# Patient Record
Sex: Male | Born: 1950 | Race: White | Hispanic: No | Marital: Single | Smoking: Current every day smoker
Health system: Southern US, Community
[De-identification: ages and names within clinical notes are randomized; demographics above are authoritative.]

## PROBLEM LIST (undated history)

## (undated) DIAGNOSIS — F329 Major depressive disorder, single episode, unspecified: Secondary | ICD-10-CM

## (undated) DIAGNOSIS — J449 Chronic obstructive pulmonary disease, unspecified: Secondary | ICD-10-CM

## (undated) DIAGNOSIS — K7460 Unspecified cirrhosis of liver: Secondary | ICD-10-CM

## (undated) DIAGNOSIS — F32A Depression, unspecified: Secondary | ICD-10-CM

## (undated) DIAGNOSIS — F419 Anxiety disorder, unspecified: Secondary | ICD-10-CM

## (undated) HISTORY — PX: HIP SURGERY: SHX245

---

## 2009-03-23 ENCOUNTER — Emergency Department (HOSPITAL_COMMUNITY): Admission: EM | Admit: 2009-03-23 | Discharge: 2009-03-23 | Payer: Self-pay | Admitting: Emergency Medicine

## 2010-02-19 ENCOUNTER — Emergency Department (HOSPITAL_COMMUNITY)
Admission: EM | Admit: 2010-02-19 | Discharge: 2010-02-19 | Payer: Self-pay | Source: Home / Self Care | Admitting: Emergency Medicine

## 2010-04-17 ENCOUNTER — Emergency Department (HOSPITAL_COMMUNITY)
Admission: EM | Admit: 2010-04-17 | Discharge: 2010-04-18 | Disposition: A | Discharge status: Other Acute Inpatient Hospital | Payer: Self-pay | Source: Home / Self Care | Admitting: Emergency Medicine

## 2010-04-18 ENCOUNTER — Inpatient Hospital Stay (HOSPITAL_COMMUNITY)
Admission: RE | Admit: 2010-04-18 | Discharge: 2010-04-26 | Payer: Self-pay | Source: Home / Self Care | Attending: Psychiatry | Admitting: Psychiatry

## 2010-04-26 LAB — COMPREHENSIVE METABOLIC PANEL
ALT: 12 U/L (ref 0–53)
AST: 22 U/L (ref 0–37)
Albumin: 3.4 g/dL — ABNORMAL LOW (ref 3.5–5.2)
Alkaline Phosphatase: 137 U/L — ABNORMAL HIGH (ref 39–117)
BUN: 9 mg/dL (ref 6–23)
CO2: 21 mEq/L (ref 19–32)
Calcium: 8.9 mg/dL (ref 8.4–10.5)
Chloride: 109 mEq/L (ref 96–112)
Creatinine, Ser: 0.81 mg/dL (ref 0.4–1.5)
GFR calc Af Amer: >60 mL/min (ref >60)
GFR calc non Af Amer: >60 mL/min (ref >60)
Glucose, Bld: 90 mg/dL (ref 70–99)
Potassium: 3.8 mEq/L (ref 3.5–5.1)
Sodium: 138 mEq/L (ref 135–145)
Total Bilirubin: 1 mg/dL (ref 0.3–1.2)
Total Protein: 6.5 g/dL (ref 6.0–8.3)

## 2010-04-26 LAB — CBC
HCT: 43.9 % (ref 39.0–52.0)
Hemoglobin: 15.5 g/dL (ref 13.0–17.0)
MCH: 31.6 pg (ref 26.0–34.0)
MCHC: 35.3 g/dL (ref 30.0–36.0)
MCV: 89.4 fL (ref 78.0–100.0)
Platelets: 210 10*3/uL (ref 150–400)
RBC: 4.91 MIL/uL (ref 4.22–5.81)
RDW: 13.8 % (ref 11.5–15.5)
WBC: 11.3 10*3/uL — ABNORMAL HIGH (ref 4.0–10.5)

## 2010-04-26 LAB — DIFFERENTIAL
Basophils Absolute: 0 10*3/uL (ref 0.0–0.1)
Basophils Relative: 0 % (ref 0–1)
Eosinophils Absolute: 0.1 10*3/uL (ref 0.0–0.7)
Eosinophils Relative: 1 % (ref 0–5)
Lymphocytes Relative: 29 % (ref 12–46)
Lymphs Abs: 3.3 10*3/uL (ref 0.7–4.0)
Monocytes Absolute: 0.7 10*3/uL (ref 0.1–1.0)
Monocytes Relative: 7 % (ref 3–12)
Neutro Abs: 7.1 10*3/uL (ref 1.7–7.7)
Neutrophils Relative %: 63 % (ref 43–77)

## 2010-04-26 LAB — URINE MICROSCOPIC-ADD ON

## 2010-04-26 LAB — SAMPLE TO BLOOD BANK

## 2010-04-26 LAB — URINALYSIS, ROUTINE W REFLEX MICROSCOPIC
Bilirubin Urine: NEGATIVE
Hgb urine dipstick: NEGATIVE
Ketones, ur: NEGATIVE mg/dL
Nitrite: POSITIVE — AB
Protein, ur: NEGATIVE mg/dL
Specific Gravity, Urine: 1.026 (ref 1.005–1.030)
Urine Glucose, Fasting: NEGATIVE mg/dL
Urobilinogen, UA: 1 mg/dL (ref 0.0–1.0)
pH: 6 (ref 5.0–8.0)

## 2010-04-26 LAB — FOLATE: Folate: 8.6 ng/mL

## 2010-04-26 LAB — VITAMIN D 1,25 DIHYDROXY
Vitamin D 1, 25 (OH)2 Total: 28 pg/mL (ref 18–72)
Vitamin D2 1, 25 (OH)2: <8 pg/mL
Vitamin D3 1, 25 (OH)2: 28 pg/mL

## 2010-04-26 LAB — RAPID URINE DRUG SCREEN, HOSP PERFORMED
Amphetamines: NOT DETECTED
Barbiturates: NOT DETECTED
Benzodiazepines: NOT DETECTED
Cocaine: NOT DETECTED
Opiates: NOT DETECTED
Tetrahydrocannabinol: NOT DETECTED

## 2010-04-26 LAB — PROTIME-INR
INR: 1.02 (ref 0.00–1.49)
Prothrombin Time: 13.6 seconds (ref 11.6–15.2)

## 2010-04-26 LAB — OCCULT BLOOD, POC DEVICE: Fecal Occult Bld: NEGATIVE

## 2010-04-26 LAB — TSH: TSH: 4.346 u[IU]/mL (ref 0.350–4.500)

## 2010-04-26 LAB — VITAMIN B12: Vitamin B-12: 328 pg/mL (ref 211–911)

## 2010-04-26 LAB — ETHANOL: Alcohol, Ethyl (B): <5 mg/dL (ref 0–10)

## 2010-04-30 NOTE — Discharge Summary (Signed)
NAME:  [name], [name] NO.:  [id number]  MEDICAL RECORD NO.:  [id number]           PATIENT TYPE:  LOCATION:                                 FACILITY:  PHYSICIAN:  Marlis Edelson, DO        DATE OF BIRTH:  [birth year]  DATE OF ADMISSION:  04/17/2010 DATE OF DISCHARGE:  04/26/2010                              DISCHARGE SUMMARY   AGE:  60.  CHIEF COMPLAINT:  Depression with suicidal thoughts.  HISTORY OF THE CHIEF COMPLAINT:  [name] is a 60 year old Caucasian male admitted to the [facility] on April 17, 2010, following initial presentation to the emergency department with complaint of abdominal pain and rectal bleeding.  He has also history positive for hepatitis C.  He mentioned that he had depressive symptoms with suicidal thoughts for 3 days prior to admission.  He had a plan to jump in front of a moving vehicle.  He was concerned about his parents getting older, his father having Alzheimer disease, and their illnesses and how he, being here in [place] was unable to help them with them living in [place].  He was endorsing voices, which he has had for a number of years, stating "take yourself and kill yourself."  He had not been sleeping well and he was not receiving any psychiatric care.  PAST PSYCHIATRIC HISTORY:  He reports a longstanding history of schizophrenia, chronic paranoid type.  This was his first admission to the [facility].  He has been on Prozac for years and found that to be effective.  He was receiving no current mental health care. He had moved to the [place] area from [place].  He wants to return to [place] following his discharge.  ALCOHOL AND DRUG HISTORY:  Denied.  MEDICAL PROBLEMS:  Current urinary tract infection.  MEDICATIONS ON ADMISSION:  Bactrim per the emergency department.  DRUG ALLERGIES:  None.  HOSPITAL COURSE:  [name] was admitted to the adult unit, where  he integrated well into the adult milieu and attended groups.  He discussed his previous psychiatric history, including the diagnosis of schizophrenia, chronic paranoid type.  He had no history of drug or alcohol problems.  He formerly worked as a Curator.  He has heard voices for a number of years, sometimes a voice telling him to kill himself.  That improved during the course of his hospitalization.  His suicidal ideation equally resolved.  He became pleasant and engaging with staff during the course of his hospitalization, was insightful, and was very engaging with his peers.  LABORATORY TESTING:  Included a TSH that was 4.346 uIU/mL. Serum folate level was 8.6 mg/mL.  Vitamin B12 level was 328 PG/mL.  He did receive a vitamin B12 injection with 1000 mcg IM times 1 due to the vitamin B12 level being below 400 PG/mL.  During the remaining part of his hospitalization, he had no suicidal or homicidal thought, intent, or plan.  His psychotic symptoms improved with the use Seroquel at bedtime.  That dose was eventually titrated to 300 mg at bedtime.  His Prozac was resumed and titrated  to 30 mg daily.  He talked about returning to [place] upon his discharge from the hospital.  That area offered him more support than he found here in [place] and he had no relatives.  He stated that he would go ahead and return to his hotel room which he has paid for through the end of the month, and then work out arrangements to return to [place]. He would also like to work on helping his family and perhaps going to [place], where he can be of further assistance to them.  There was no behavioral discord, hypomania, or mania.  No actions of self-harm or of harming others during the course of his hospitalization. His only concern later in the hospitalization was his inability to smoke at this particular program.  He had no adverse reactions to medications. He tolerated the Seroquel at  300 mg at bedtime, Prozac 30 mg daily.  He also was placed on Bactrim 1 tablet twice per day for 7 days for urinary tract infection as per the above note.  LABORATORY AND IMAGING:  As noted above.  CONSULTATIONS:  None.  COMPLICATIONS:  None.  PROCEDURES:  None.  DISCHARGE IMPRESSIONS:  Axis I:  Schizophrenia, chronic paranoid type, depressive disorder not otherwise specified. Axis II:  Deferred. Axis III:  Urinary tract infection. Axis IV:  Family stressors due to parents' illness. Axis V:  60.  DISCHARGE INSTRUCTIONS: 1. Followup:  He is to follow up with the [facility] on April 27, 2010, at 9 a.m. 2. Medications:     a.     Prozac 30 mg p.o. daily.     b.     Seroquel 300 mg p.o. at bedtime.  SPECIAL INSTRUCTIONS:  He is to return to the hospital for any development of suicidal or homicidal thought, intent, or plan or marked change in mood or affect or increased psychotic symptoms.  He is also to seek emergent care for any adverse reactions to medications.  Keep followup appointments as recommended.  PROGNOSIS:  Fair with appropriate psychiatric followup and medication compliance.      ______________________________ Marlis Edelson, DO     DB/MEDQ  D:  04/26/2010  T:  04/26/2010  Job:  [id number]  Electronically Signed by Marlis Edelson MD on 04/29/2010 07:16:29 PM

## 2010-06-16 ENCOUNTER — Emergency Department (HOSPITAL_COMMUNITY): Payer: Medicare Other

## 2010-06-16 ENCOUNTER — Inpatient Hospital Stay (HOSPITAL_COMMUNITY)
Admission: EM | Admit: 2010-06-16 | Discharge: 2010-06-21 | DRG: 418 | Disposition: A | Discharge status: Other Acute Inpatient Hospital | Payer: Medicare Other | Attending: Internal Medicine | Admitting: Internal Medicine

## 2010-06-16 DIAGNOSIS — F3289 Other specified depressive episodes: Secondary | ICD-10-CM | POA: Diagnosis present

## 2010-06-16 DIAGNOSIS — F200 Paranoid schizophrenia: Secondary | ICD-10-CM | POA: Diagnosis present

## 2010-06-16 DIAGNOSIS — Z5900 Homelessness unspecified: Secondary | ICD-10-CM

## 2010-06-16 DIAGNOSIS — K8000 Calculus of gallbladder with acute cholecystitis without obstruction: Principal | ICD-10-CM | POA: Diagnosis present

## 2010-06-16 DIAGNOSIS — N200 Calculus of kidney: Secondary | ICD-10-CM | POA: Diagnosis present

## 2010-06-16 DIAGNOSIS — R45851 Suicidal ideations: Secondary | ICD-10-CM

## 2010-06-16 DIAGNOSIS — B1920 Unspecified viral hepatitis C without hepatic coma: Secondary | ICD-10-CM | POA: Diagnosis present

## 2010-06-16 DIAGNOSIS — F329 Major depressive disorder, single episode, unspecified: Secondary | ICD-10-CM | POA: Diagnosis present

## 2010-06-16 DIAGNOSIS — F17200 Nicotine dependence, unspecified, uncomplicated: Secondary | ICD-10-CM | POA: Diagnosis present

## 2010-06-16 DIAGNOSIS — N1330 Unspecified hydronephrosis: Secondary | ICD-10-CM | POA: Diagnosis present

## 2010-06-16 LAB — URINE MICROSCOPIC-ADD ON

## 2010-06-16 LAB — CBC
HCT: 40.3 % (ref 39.0–52.0)
Hemoglobin: 13.7 g/dL (ref 13.0–17.0)
MCH: 30.4 pg (ref 26.0–34.0)
MCHC: 34 g/dL (ref 30.0–36.0)
MCV: 89.4 fL (ref 78.0–100.0)
Platelets: 154 10*3/uL (ref 150–400)
RBC: 4.51 MIL/uL (ref 4.22–5.81)
RDW: 15.1 % (ref 11.5–15.5)
WBC: 5.4 10*3/uL (ref 4.0–10.5)

## 2010-06-16 LAB — URINALYSIS, ROUTINE W REFLEX MICROSCOPIC
Bilirubin Urine: NEGATIVE
Glucose, UA: NEGATIVE mg/dL
Hgb urine dipstick: NEGATIVE
Ketones, ur: NEGATIVE mg/dL
Nitrite: NEGATIVE
Protein, ur: NEGATIVE mg/dL
Specific Gravity, Urine: 1.007 (ref 1.005–1.030)
Urobilinogen, UA: 1 mg/dL (ref 0.0–1.0)
pH: 7 (ref 5.0–8.0)

## 2010-06-16 LAB — DIFFERENTIAL
Basophils Absolute: 0 10*3/uL (ref 0.0–0.1)
Basophils Relative: 0 % (ref 0–1)
Eosinophils Absolute: 0.1 10*3/uL (ref 0.0–0.7)
Eosinophils Relative: 1 % (ref 0–5)
Lymphocytes Relative: 29 % (ref 12–46)
Lymphs Abs: 1.6 10*3/uL (ref 0.7–4.0)
Monocytes Absolute: 0.4 10*3/uL (ref 0.1–1.0)
Monocytes Relative: 7 % (ref 3–12)
Neutro Abs: 3.4 10*3/uL (ref 1.7–7.7)
Neutrophils Relative %: 63 % (ref 43–77)

## 2010-06-16 LAB — COMPREHENSIVE METABOLIC PANEL
ALT: 847 U/L — ABNORMAL HIGH (ref 0–53)
AST: 351 U/L — ABNORMAL HIGH (ref 0–37)
Albumin: 3 g/dL — ABNORMAL LOW (ref 3.5–5.2)
Alkaline Phosphatase: 286 U/L — ABNORMAL HIGH (ref 39–117)
BUN: 10 mg/dL (ref 6–23)
CO2: 25 mEq/L (ref 19–32)
Calcium: 8.7 mg/dL (ref 8.4–10.5)
Chloride: 104 mEq/L (ref 96–112)
Creatinine, Ser: 0.75 mg/dL (ref 0.4–1.5)
GFR calc Af Amer: >60 mL/min (ref >60)
GFR calc non Af Amer: >60 mL/min (ref >60)
Glucose, Bld: 100 mg/dL — ABNORMAL HIGH (ref 70–99)
Potassium: 3.7 mEq/L (ref 3.5–5.1)
Sodium: 137 mEq/L (ref 135–145)
Total Bilirubin: 4.1 mg/dL — ABNORMAL HIGH (ref 0.3–1.2)
Total Protein: 6.2 g/dL (ref 6.0–8.3)

## 2010-06-16 LAB — LIPASE, BLOOD: Lipase: 36 U/L (ref 11–59)

## 2010-06-16 LAB — ETHANOL: Alcohol, Ethyl (B): <5 mg/dL (ref 0–10)

## 2010-06-16 LAB — RAPID URINE DRUG SCREEN, HOSP PERFORMED
Amphetamines: NOT DETECTED
Barbiturates: NOT DETECTED
Benzodiazepines: NOT DETECTED
Cocaine: NOT DETECTED
Opiates: NOT DETECTED
Tetrahydrocannabinol: NOT DETECTED

## 2010-06-16 MED ORDER — IOHEXOL 300 MG/ML  SOLN
100.0000 mL | Freq: Once | INTRAMUSCULAR | Status: AC | PRN
  Administered 2010-06-16: 100 mL via INTRAVENOUS

## 2010-06-17 ENCOUNTER — Other Ambulatory Visit (HOSPITAL_COMMUNITY): Payer: Medicare Other

## 2010-06-17 ENCOUNTER — Inpatient Hospital Stay (HOSPITAL_COMMUNITY): Payer: Medicare Other

## 2010-06-17 DIAGNOSIS — F200 Paranoid schizophrenia: Secondary | ICD-10-CM

## 2010-06-17 DIAGNOSIS — R932 Abnormal findings on diagnostic imaging of liver and biliary tract: Secondary | ICD-10-CM

## 2010-06-17 LAB — LIPID PANEL
Triglycerides: 158 mg/dL — ABNORMAL HIGH (ref <150)
VLDL: 32 mg/dL (ref 0–40)

## 2010-06-17 LAB — CBC
HCT: 40.4 % (ref 39.0–52.0)
Hemoglobin: 13.6 g/dL (ref 13.0–17.0)
RBC: 4.52 MIL/uL (ref 4.22–5.81)
WBC: 5.4 10*3/uL (ref 4.0–10.5)

## 2010-06-17 LAB — COMPREHENSIVE METABOLIC PANEL
ALT: 574 U/L — ABNORMAL HIGH (ref 0–53)
AST: 183 U/L — ABNORMAL HIGH (ref 0–37)
Albumin: 2.8 g/dL — ABNORMAL LOW (ref 3.5–5.2)
Alkaline Phosphatase: 261 U/L — ABNORMAL HIGH (ref 39–117)
CO2: 23 mEq/L (ref 19–32)
Chloride: 110 mEq/L (ref 96–112)
Creatinine, Ser: 0.68 mg/dL (ref 0.4–1.5)
GFR calc Af Amer: >60 mL/min (ref >60)
Potassium: 3.5 mEq/L (ref 3.5–5.1)
Sodium: 140 mEq/L (ref 135–145)
Total Bilirubin: 2.5 mg/dL — ABNORMAL HIGH (ref 0.3–1.2)

## 2010-06-17 LAB — HEPATITIS PANEL, ACUTE
HCV Ab: REACTIVE — AB
Hepatitis B Surface Ag: NEGATIVE

## 2010-06-17 LAB — BRAIN NATRIURETIC PEPTIDE: Pro B Natriuretic peptide (BNP): 124 pg/mL — ABNORMAL HIGH (ref 0.0–100.0)

## 2010-06-17 LAB — MAGNESIUM: Magnesium: 1.9 mg/dL (ref 1.5–2.5)

## 2010-06-18 ENCOUNTER — Other Ambulatory Visit: Payer: Self-pay | Admitting: Surgery

## 2010-06-18 ENCOUNTER — Inpatient Hospital Stay (HOSPITAL_COMMUNITY): Payer: Medicare Other

## 2010-06-18 DIAGNOSIS — F329 Major depressive disorder, single episode, unspecified: Secondary | ICD-10-CM

## 2010-06-18 DIAGNOSIS — R932 Abnormal findings on diagnostic imaging of liver and biliary tract: Secondary | ICD-10-CM

## 2010-06-18 DIAGNOSIS — I369 Nonrheumatic tricuspid valve disorder, unspecified: Secondary | ICD-10-CM

## 2010-06-18 DIAGNOSIS — F3289 Other specified depressive episodes: Secondary | ICD-10-CM

## 2010-06-18 LAB — COMPREHENSIVE METABOLIC PANEL
Alkaline Phosphatase: 227 U/L — ABNORMAL HIGH (ref 39–117)
BUN: 5 mg/dL — ABNORMAL LOW (ref 6–23)
Chloride: 109 mEq/L (ref 96–112)
GFR calc non Af Amer: >60 mL/min (ref >60)
Glucose, Bld: 109 mg/dL — ABNORMAL HIGH (ref 70–99)
Potassium: 4 mEq/L (ref 3.5–5.1)
Total Bilirubin: 1.9 mg/dL — ABNORMAL HIGH (ref 0.3–1.2)

## 2010-06-18 LAB — DIFFERENTIAL
Eosinophils Relative: 3 % (ref 0–5)
Lymphocytes Relative: 31 % (ref 12–46)
Lymphs Abs: 1.5 10*3/uL (ref 0.7–4.0)
Monocytes Relative: 9 % (ref 3–12)

## 2010-06-18 LAB — CBC
HCT: 39.6 % (ref 39.0–52.0)
MCV: 91.9 fL (ref 78.0–100.0)
RBC: 4.31 MIL/uL (ref 4.22–5.81)
RDW: 15.2 % (ref 11.5–15.5)
WBC: 4.9 10*3/uL (ref 4.0–10.5)

## 2010-06-18 LAB — PHOSPHORUS: Phosphorus: 4.5 mg/dL (ref 2.3–4.6)

## 2010-06-18 LAB — MRSA PCR SCREENING: MRSA by PCR: NEGATIVE

## 2010-06-18 LAB — MAGNESIUM: Magnesium: 1.9 mg/dL (ref 1.5–2.5)
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
## 2010-06-19 LAB — CBC
HCT: 42.4 % (ref 39.0–52.0)
MCHC: 32.5 g/dL (ref 30.0–36.0)
RDW: 15.2 % (ref 11.5–15.5)

## 2010-06-19 LAB — MAGNESIUM: Magnesium: 1.7 mg/dL (ref 1.5–2.5)

## 2010-06-19 LAB — COMPREHENSIVE METABOLIC PANEL
ALT: 314 U/L — ABNORMAL HIGH (ref 0–53)
Calcium: 8.4 mg/dL (ref 8.4–10.5)
GFR calc Af Amer: >60 mL/min (ref >60)
Glucose, Bld: 147 mg/dL — ABNORMAL HIGH (ref 70–99)
Sodium: 138 mEq/L (ref 135–145)
Total Protein: 5.3 g/dL — ABNORMAL LOW (ref 6.0–8.3)

## 2010-06-19 LAB — DIFFERENTIAL
Basophils Absolute: 0 10*3/uL (ref 0.0–0.1)
Basophils Relative: 0 % (ref 0–1)
Eosinophils Relative: 2 % (ref 0–5)
Monocytes Absolute: 0.4 10*3/uL (ref 0.1–1.0)

## 2010-06-19 NOTE — Consult Note (Signed)
NAME:  [name], [name] NO.:  [id number]  MEDICAL RECORD NO.:  [id number]           PATIENT TYPE:  I  LOCATION:  3005                         FACILITY:  [facility]  PHYSICIAN:  Almond Lint, MD       DATE OF BIRTH:  11/11/1950  DATE OF CONSULTATION:  06/17/2010 DATE OF DISCHARGE:                                CONSULTATION   REQUESTING PROVIDER:  Dr. Hyacinth Meeker in the emergency department.  CHIEF COMPLAINT:  Abdominal pain.  HISTORY OF PRESENT ILLNESS:  [name] is a 60 year old male who actually presented to the emergency department with depression and suicidal ideation x2 weeks.  He was discharged January 17 for similar reasons.  While he was here undergoing psychiatric workup, he complained of abdominal pain for 7 days.  To the emergency department and to me he denied nausea, vomiting, fever, chills, chest pain, shortness of breath, or changes in his bowel habits.  He did state that he had decreased appetite.  He is unable to tell me any factors that relieved the pain or exacerbate the pain.  He denies food exacerbating the pain.  PAST MEDICAL HISTORY:  Significant for schizophrenia and depression with a prior suicide attempt.  He also has hepatitis C and history of kidney stones.  PAST SURGICAL HISTORY:  Negative.  FAMILY HISTORY:  He denies any known knowledge of gallbladder disease in his family.  SOCIAL HISTORY:  He is homeless and smokes a pack a day.  He does not use alcohol or drugs.  REVIEW OF SYSTEMS:  Otherwise negative x11 systems.  MEDICATIONS:  None.  ALLERGIES:  None.  PHYSICAL EXAMINATION:  VITAL SIGNS:  Temperature 97.7, heart rate 69, blood pressure 109/71, respiratory rate 16, 94% on room air. GENERAL:  He is sleepy and withdrawn, crawled up in bed.  He is disheveled and wearing a hospital paper scrub. NECK:  Supple. HEART:  Regular rate and rhythm.  No murmurs. LUNGS:  He has wheezing on inspiration bilaterally with normal  effort. ABDOMEN:  Soft and nondistended.  Tender in the right upper quadrant. EXTREMITIES:  Warm and well perfused with palpable pulses bilaterally and no gross deformities. NEURO:  No gross deficits. PSYCH:  Withdrawn, angry, and flat affect.  LABORATORY DATA:  Sodium 137, potassium 3.7, chloride 104, CO2 25, BUN 10, creatinine 0.75, glucose 100, bilirubin 4.1, AST 351, ALT 847, alk phos 286, white count 5.4, hemoglobin/hematocrit 13.7 and 40.3 and platelet count 154,000.  CT shows a 8 mm gallstone lodged in the gallbladder neck, and the gallbladder wall near the infundibulum is slightly indistinct.  He has nonobstructing renal calculi.  Chest x-ray shows chronic bronchitis.  ASSESSMENT/PLAN:  [name] is 60 year old male with depression and suicidal ideation along with cholecystitis, cholelithiasis and possible choledocholithiasis.  We will recheck his LFTs in the morning and consult GI for possible ERCP as needed.  I think his LFTs are coming from his gallbladder as his LFTs back in January were normal.  IV antibiotics, n.p.o., and lap chole when more medically stable.     Almond Lint, MD     FB/MEDQ  D:  06/17/2010  T:  06/17/2010  Job:  [id number]  Electronically Signed by Almond Lint MD on 06/19/2010 06:33:17 AM

## 2010-06-20 LAB — CBC
Hemoglobin: 13.3 g/dL (ref 13.0–17.0)
MCH: 30.7 pg (ref 26.0–34.0)
RBC: 4.33 MIL/uL (ref 4.22–5.81)
WBC: 6.7 10*3/uL (ref 4.0–10.5)

## 2010-06-20 LAB — COMPREHENSIVE METABOLIC PANEL
ALT: 225 U/L — ABNORMAL HIGH (ref 0–53)
AST: 52 U/L — ABNORMAL HIGH (ref 0–37)
Alkaline Phosphatase: 196 U/L — ABNORMAL HIGH (ref 39–117)
CO2: 27 mEq/L (ref 19–32)
Chloride: 106 mEq/L (ref 96–112)
Creatinine, Ser: 0.91 mg/dL (ref 0.4–1.5)
GFR calc Af Amer: >60 mL/min (ref >60)
GFR calc non Af Amer: >60 mL/min (ref >60)
Potassium: 4 mEq/L (ref 3.5–5.1)
Sodium: 138 mEq/L (ref 135–145)
Total Bilirubin: 1.7 mg/dL — ABNORMAL HIGH (ref 0.3–1.2)

## 2010-06-20 LAB — PHOSPHORUS: Phosphorus: 3.8 mg/dL (ref 2.3–4.6)

## 2010-06-20 LAB — MAGNESIUM: Magnesium: 1.7 mg/dL (ref 1.5–2.5)

## 2010-06-21 ENCOUNTER — Inpatient Hospital Stay (HOSPITAL_COMMUNITY)
Admission: AD | Admit: 2010-06-21 | Discharge: 2010-07-12 | DRG: 885 | Disposition: A | Discharge status: Home / Self Care | Payer: Medicare Other | Source: Ambulatory Visit | Attending: Psychiatry | Admitting: Psychiatry

## 2010-06-21 DIAGNOSIS — F200 Paranoid schizophrenia: Principal | ICD-10-CM

## 2010-06-21 DIAGNOSIS — N390 Urinary tract infection, site not specified: Secondary | ICD-10-CM

## 2010-06-21 DIAGNOSIS — F17200 Nicotine dependence, unspecified, uncomplicated: Secondary | ICD-10-CM

## 2010-06-21 DIAGNOSIS — R7989 Other specified abnormal findings of blood chemistry: Secondary | ICD-10-CM

## 2010-06-21 DIAGNOSIS — F329 Major depressive disorder, single episode, unspecified: Secondary | ICD-10-CM

## 2010-06-21 DIAGNOSIS — B1920 Unspecified viral hepatitis C without hepatic coma: Secondary | ICD-10-CM

## 2010-06-21 DIAGNOSIS — F3289 Other specified depressive episodes: Secondary | ICD-10-CM

## 2010-06-21 DIAGNOSIS — R45851 Suicidal ideations: Secondary | ICD-10-CM

## 2010-06-21 NOTE — Op Note (Signed)
NAME:  LAYKEN, DOENGES NO.:  [id number]  MEDICAL RECORD NO.:  [id number]           PATIENT TYPE:  I  LOCATION:  3005                         FACILITY:  [facility]  PHYSICIAN:  Currie Paris, M.D.DATE OF BIRTH:  Mar 24, 1951  DATE OF PROCEDURE:  06/18/2010 DATE OF DISCHARGE:                              OPERATIVE REPORT   PREOPERATIVE DIAGNOSES:  Acute cholecystitis, possible choledocholithiasis.  POSTOPERATIVE DIAGNOSIS:  Acute cholecystitis.  PROCEDURE:  Laparoscopic cholecystectomy with operative cholangiogram.  SURGEON:  Currie Paris, MD  ANESTHESIA:  General.  CLINICAL HISTORY:  This is a 60 year old man, who has some schizophrenia and apparently came in with some suicidal ideations, but also had abdominal pain.  Workup for his abdominal pain showed that he had acute cholecystitis, slight elevation of his liver functions, but was known to have hepatitis C.  His liver functions were little bit better this morning.  He continued to have right upper quadrant pain, and we elected to proceed to cholecystectomy.  DESCRIPTION OF PROCEDURE:  I saw the patient in the holding area again preoperatively.  He had no further questions.  He was taken to the operating room and after satisfactory general endotracheal anesthesia had been obtained, the abdomen was prepped and draped.  The time-out was done.  I used 0.25% plain Marcaine for each incision.  The umbilical incision was made.  The fascia identified and opened, and the peritoneal cavity entered under direct vision.  The pursestring was then placed and the Hasson introduced.  The abdomen was insufflated to 15.  The camera was placed and there did not appear to be any gross abnormalities.  The gallbladder was a little bit tensed and slightly edematous, but there was no significant acute inflammation.  With the patient in reverse Trendelenburg and tilted to the left, 10 and 11 trocar was placed in  the epigastrium and two 5's laterally.  The gallbladder was retracted over the liver.  Omental adhesions were taken down.  The area of the cystic duct was opened, and I was able to dissect out a long segment of cystic artery, cystic duct.  The cystic artery was clipped and divided.  The cystic duct was identified and a long segment dissected out and then it was clipped.  A Cook catheter was introduced percutaneously and operative angiography done.  The cystic duct was fairly small, but I was able get the catheter entered and do an operative angiogram, which appeared normal to me.  Cystic duct catheter was removed and three clips placed on it.  That was divided.  The gallbladder was removed from below to above with coagulation with current cautery.  There was a little bit of bile spillage, so we took care to irrigate that all out at the end of the case.  Once it was disconnected, I put it in a bag.  I irrigated and made sure the bed of the gallbladder was dry.  The gallbladder was extracted through the umbilical port.  The port was occluded while I did a final irrigation and checked for hemostasis and again everything was dry.  The lateral ports were removed  under direct vision.  A pursestring was used to close the umbilical site with the camera in the epigastric port to be sure we did not enter bowel.  The abdomen was deflated through the epigastric port.  Skin was closed with 4-0 Monocryl subcuticular and Dermabond.  The patient tolerated the procedure well.  There were no complications. All counts were correct.     Currie Paris, M.D.     CJS/MEDQ  D:  06/18/2010  T:  06/19/2010  Job:  [id number]  Electronically Signed by Cyndia Bent M.D. on 06/21/2010 07:06:34 AM

## 2010-06-22 LAB — URINE MICROSCOPIC-ADD ON

## 2010-06-22 LAB — URINALYSIS, ROUTINE W REFLEX MICROSCOPIC
Ketones, ur: NEGATIVE mg/dL
Nitrite: POSITIVE — AB
Protein, ur: NEGATIVE mg/dL
Urobilinogen, UA: 0.2 mg/dL (ref 0.0–1.0)
pH: 5.5 (ref 5.0–8.0)

## 2010-06-22 LAB — URINE CULTURE
Colony Count: 85000
Culture  Setup Time: 201111111456
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
## 2010-06-28 ENCOUNTER — Inpatient Hospital Stay (HOSPITAL_COMMUNITY): Payer: Medicare Other

## 2010-06-28 ENCOUNTER — Ambulatory Visit (HOSPITAL_COMMUNITY): Admission: RE | Admit: 2010-06-28 | Payer: Medicare Other | Source: Other Acute Inpatient Hospital

## 2010-06-28 LAB — COMPREHENSIVE METABOLIC PANEL
ALT: 40 U/L (ref 0–53)
AST: 28 U/L (ref 0–37)
Calcium: 9 mg/dL (ref 8.4–10.5)
Creatinine, Ser: 1.02 mg/dL (ref 0.4–1.5)
GFR calc Af Amer: >60 mL/min (ref >60)
Sodium: 135 mEq/L (ref 135–145)
Total Protein: 7.4 g/dL (ref 6.0–8.3)

## 2010-06-28 LAB — CBC
MCH: 30.5 pg (ref 26.0–34.0)
MCHC: 33.3 g/dL (ref 30.0–36.0)
RDW: 13.9 % (ref 11.5–15.5)

## 2010-06-29 LAB — ANA: Anti Nuclear Antibody(ANA): POSITIVE — AB

## 2010-06-29 LAB — VITAMIN B12: Vitamin B-12: 886 pg/mL (ref 211–911)

## 2010-06-29 LAB — ANTI-NUCLEAR AB-TITER (ANA TITER): ANA Titer 1: NEGATIVE

## 2010-06-30 NOTE — Discharge Summary (Signed)
NAME:  [name] NO.:  [id number]  MEDICAL RECORD NO.:  [id number]           PATIENT TYPE:  I  LOCATION:  3005                         FACILITY:  [facility]  PHYSICIAN:  Rock Nephew, MD       DATE OF BIRTH:  05/03/1950  DATE OF ADMISSION:  06/17/2010 DATE OF DISCHARGE:                        DISCHARGE SUMMARY - REFERRING   Date of discharge to be determined.  The patient does not seem to have a primary care physician.  DISCHARGE DIAGNOSES: 1. Acute cholecystitis, status post cholecystectomy. 2. Laparoscopic cholecystectomy. 3. Suicidal ideations. 4. Depression. 5. History of hepatitis C. 6. Tobacco abuse. 7. Increased BNP with echocardiogram, ejection fraction 55%. 8. Increased LFTs from acute cholecystitis.  DISCHARGE MEDICATIONS:  Will be dictated at the time of discharge.  They should include psychiatric medications, quetiapine and Prozac.  CONSULTATIONS ON THIS CASE: 1. Eulogio Ditch, MD, Psychiatry. 2. Dr. Donell Beers. 3. Currie Paris, MD, [facility]. 4. [facility].  The patient's diet, currently the patient is on n.p.o. diet.  Diet will be advanced for surgery.  DISPOSITION:  The patient will need inpatient psych after surgery has cleared the patient to leave the hospital.  PROCEDURES PERFORMED:  The patient has had a LAP CHOLE performed on June 18, 2010.  The patient also has had a CT scan of the abdomen and pelvis, which had showed 8 mm gallstone lodged in gallbladder neck or cystic duct with indistinctness around the gallbladder.  Findings concerning for acute cholecystitis.  The patient had portable chest x- ray, which showed central airway thickening consistent with chronic bronchitis, small density adjacent to left cardiac apex, significance unknown without prior films comparison.  The patient also had an ultrasound of the abdomen, which shows 14-mm gallstone in the gallbladder neck with associated  gallbladder wall thickening edema suggest acute cholecystitis and normal caliber common bile duct.  The patient also had an intraoperative cholangiogram during the laparoscopic cholecystectomy, which showed no evidence of filling defect to suggest the presence of retained common duct stone.  After discharge, the patient should follow up and establish care with the primary care physician.  The patient should also see a gastroenterologist, and the patient should also follow up with Dr. Jamey Ripa.  BRIEF HISTORY OF PRESENT ILLNESS:  This is a 60 year old homeless guy who has a history of schizophrenia who came to the hospital with abdominal pain and also with suicidal ideations.  The patient was admitted to the hospital with working diagnosis of suicidal ideations and acute cholecystitis.  HOSPITAL COURSE: 1. Acute cholecystitis.  The patient was admitted to hospital.  The     patient was seen in consultation with [facility] as well as [facility].  The patient had acute cholecystitis.  The     patient was taken to the operating room on June 18, 2010.  The     patient had a laparoscopic cholecystectomy.  The patient is     currently n.p.o. 2. Suicidal ideations.  The patient had suicidal ideations.  The     patient was seen by Dr. Rogers Blocker.  Initially, the  patient had a     Comptroller.  The patient was agreeable to going to Psych after the     surgical procedures have been done.  I spoke to Dr. Rogers Blocker.  He     said that if the patient wanted to leave the hospital, the patient     will need to be committed and the sitter will need to be brought     back.  However, Dr. Rogers Blocker had discontinued the sitter for the     time being. 3. Depression.  The patient was started on Prozac. 4. History of hepatitis C.  The patient has history of hepatitis C,     which was confirmed.  The patient will need an outpatient     evaluation by GI after the patient is discharged from the  hospital.     However, now the patient is a candidate for treatment with     interferon secondary to the psychiatric problems. 5. Tobacco abuse.  The patient was counseled. 6. Increased BNP.  The patient had a 2-D echocardiogram done.  The 2-D     echocardiogram showed left ventricular ejection fraction of 55%.     Cavity was mildly dilated.  Left atrium mildly dilated and right     atrium mildly dilated. 7. Increased LFTs.  The patient has had increased LFTs.  This is most     likely related to acute cholecystitis.  The patient's peak LFTs     were AST 351, ALT 847, and alk phos 286.  The patient's LFTs on     June 19, 2010, are alk phos 213, AST 80, ALT 314, total bili is     1.8.  The peak total bili was 4.1.     Rock Nephew, MD     NH/MEDQ  D:  06/19/2010  T:  06/19/2010  Job:  [id number]  Electronically Signed by Rock Nephew MD on 06/30/2010 04:29:29 PM

## 2010-07-01 LAB — GLUCOSE, CAPILLARY: Glucose-Capillary: 96 mg/dL (ref 70–99)

## 2010-07-04 LAB — URINALYSIS, ROUTINE W REFLEX MICROSCOPIC
Bilirubin Urine: NEGATIVE
Glucose, UA: NEGATIVE mg/dL
Hgb urine dipstick: NEGATIVE
Specific Gravity, Urine: 1.016 (ref 1.005–1.030)
Urobilinogen, UA: 1 mg/dL (ref 0.0–1.0)

## 2010-07-04 LAB — URINE MICROSCOPIC-ADD ON

## 2010-07-10 DIAGNOSIS — F200 Paranoid schizophrenia: Secondary | ICD-10-CM

## 2010-07-11 DIAGNOSIS — F200 Paranoid schizophrenia: Secondary | ICD-10-CM

## 2010-07-13 LAB — URINALYSIS, ROUTINE W REFLEX MICROSCOPIC
Bilirubin Urine: NEGATIVE
Nitrite: NEGATIVE
Specific Gravity, Urine: 1.021 (ref 1.005–1.030)
Urobilinogen, UA: 0.2 mg/dL (ref 0.0–1.0)
pH: 5.5 (ref 5.0–8.0)

## 2010-07-13 LAB — URINE MICROSCOPIC-ADD ON

## 2010-07-15 NOTE — Discharge Summary (Signed)
NAME:  [name], [name] NO.:  [id number]  MEDICAL RECORD NO.:  [id number]           PATIENT TYPE:  I  LOCATION:  0507                          [facility]ACILITY:  [facility]  PHYSICIAN:  Marlis Edelson, DO        DATE OF BIRTH:  Feb 08, 1951  DATE OF ADMISSION:  06/21/2010 DATE OF DISCHARGE:  07/12/2010                              DISCHARGE SUMMARY   REASON FOR ADMISSION:  This is a 60 year old male with known history of chronic paranoid schizophrenia, admitted after the patient had an inpatient stay where he underwent a cholecystectomy due to cholecystitis.  During his hospitalization he was expressing suicidal thoughts and was transferred due to his suicidal thinking. He was also endorsing voices that were telling him to kill himself and telling him derogatory statements.  He has also been off of his medications for some period of time.  ASSESSMENT:  AXIS I:  Schizophrenia, chronic paranoid type.  Depressive disorder, not otherwise specified. AXIS II: Deferred. AXIS III: 1. Status post cholecystectomy due to cholelithiasis. 2. History of renal lithiasis. 3. History of hepatitis C. AXIS IV:  Limited social support and burden of illness. AXIS V:  Current is 50-55.  SIGNIFICANT LABORATORY DATA:  LFTs are elevated with an AST of 351 and ALT of 647.  Alcohol level was less than 5.  Urine drug screen was negative.  FINDINGS:  This is a 60 year old male who is well-developed, well- nourished, disheveled, and having some postop pain.  His surgical sites were healing without major problems.  His speech is clear, coherent, regular rate and rhythm.  His mood was depressed.  His affect was congruent.  Thought processes were linear. The patient was endorsing auditory hallucinations and visual hallucinations or command hallucinations to harm himself.  He was promising safety.  He was integrated into the adult milieu, was encouraged to participate in groups.  Will continue his transfer  medications. We provided oxycodone for postop pain.  The patient was improving slowly.  The patient was improving slowly, although endorsing feelings of depression, had a plan to commit suicide by jumping in front of a bus.  He was having some trouble sleeping and eating.  He had some abdominal bloating and was again endorsing voices to "kill himself."  We increased his Seroquel for his psychosis.  The patient was encouraged to be more active. We contacted the patient's sister to gather information, to address support and to provide information.  His energy was somewhat low but he was sleeping better.  He was taking Ensure.  Having feelings of worthlessness. His voices were improving, still of the command but he was not listening to them.  We increased his Prozac and were beginning to taper his.  He was tolerating his medications.  The patient was complaining of some knee pain and we did an x-ray of his knee which showed a healing medial tibial fracture.  His overall pain was better. He was having normal bowel movements.  He was still feeling somewhat depressed. His pain continued to improve as well as his appetite, nausea was less, tolerating his prescriptions well.  His knee  was improving as is injury was greater than 6 weeks ago.  We continued to taper his Percocet. The patient remained in bed and we had encouraged the patient to participate in groups.  He had poor hygiene. We had a plan to have the patient go to assisted-living but he wanted to go back and live in a hotel on his own. The patient had a urinary tract infection and was started on Cipro.  His sleep had improved, appetite had improved.  He was denying any suicidal or homicidal thoughts, endorsing mild paranoia. The patient was improving slowly.  He felt safe here.  We had discontinued the patient's Percocet and his blood pressure had stabilized. He initially had some issues with hypertension.  The patient began to ask for  discharge, felt he was doing well.  On the day of discharge the patient was fully alert and cooperative.  He had showered and was feeling much better.  He thanked [place] for the care he had received.  He denied any suicidal thoughts and denied any current hallucinations.  He had much improved eye contact and was asking appropriate questions.  We felt the patient was stable for discharge.  DISCHARGE MEDICATIONS:  Prozac 20 mg, taking two daily, quetiapine 400 mg one q.h.s. Eucerin cream topically t.i.d.  FOLLOWUP:  He had an appointment with [facility] on Tuesday, April 3rd at 5:00 p.m. and John from [facility] would come to the patient's hotel to meet him.     Landry Corporal, N.P.   ______________________________ Marlis Edelson, DO    JO/MEDQ  D:  07/13/2010  T:  07/14/2010  Job:  [id number]  Electronically Signed by Limmie PatriciaP. on 07/15/2010 10:41:54 AM Electronically Signed by Marlis Edelson MD on 07/15/2010 08:44:19 PM

## 2010-07-20 NOTE — Discharge Summary (Signed)
NAME:  [name], [name] NO.:  [id number]  MEDICAL RECORD NO.:  [id number]           PATIENT TYPE:  I  LOCATION:  3005                         FACILITY:  [facility]  PHYSICIAN:  Altha Harm, MDDATE OF BIRTH:  10/20/1950  DATE OF ADMISSION:  06/16/2010 DATE OF DISCHARGE:  06/21/2010                        DISCHARGE SUMMARY - REFERRING   DISCHARGE DISPOSITION:  Inpatient Behavioral Health.  FINAL DISCHARGE DIAGNOSES: 1. Acute cholecystitis, status post cholecystectomy. 2. Major depression with suicidal ideations. 3. Tobacco use disorder. 4. History of hepatitis C. 5. Increased LFTs, now resolved. 6. Renal calculi with mild lower pole hydronephrosis.  DISCHARGE MEDICATIONS:  Include the following 1. Tylenol 650 mg p.o. q.4 h. p.r.n. pain. 2. Zofran 4 mg p.o. q.6 h. p.r.n. nausea. 3. Percocet 5/325 1-2 tablets p.o. q.4 h. p.r.n. pain. 4. Prozac 30 mg p.o. daily. 5. Seroquel 300 mg p.o. at bedtime.  CONSULTANTS: 1. Sandria Bales. Ezzard Standing, MD, Surgery. 2. Eulogio Ditch, MD, Psychiatry.  PROCEDURES:  Status post laparoscopic cholecystectomy with operative cholangiogram done on June 18, 2010.  DIAGNOSTIC STUDIES: 1. Two-view chest x-ray on admission which showed central airway     thickening consistent with chronic bronchitis. 2. CT of the abdomen and pelvis which shows 8 mm gallstone, the     gallbladder neck or cystic duct was indistinctness around the     gallbladder.  Findings concerning for focal cholecystitis. 3. Multiple nonobstructing left lower pole renal calculi, largest     measuring 9 mm. 4. Ultrasound of the abdomen on the 8th, which shows a 40 mm gallstone     in the gallbladder neck with associated gallbladder wall thickening     and edema suggestive of acute cholecystitis. 5. A 2D echocardiogram which shows mild elevation of the left     ventricle with estimated ejection fraction of 55%, left atrium     mildly dilated and right atrium  mildly dilated.  IMPRESSION: 1. Normal caliber common bile duct. 2. Lower pole left renal calculi and mild lower pole hydronephrosis.  CODE STATUS:  Full code.  PRIMARY CARE PHYSICIAN:  Unassigned.  CHIEF COMPLAINT:  Abdominal pain and suicidal ideations.  HISTORY OF PRESENT ILLNESS:  Please refer to the H and P by Dr. Mikeal Hawthorne for details of the HPI.  However, this is a 60 year old gentleman who is homeless who presents with suicidal ideations and abdominal pain.  HOSPITAL COURSE: 1. Suicidal ideation.  The patient is noted to have major depression     and has at times been in psychosis.  However, in this admission the     patient was not psychotic, but has major depression and suicidal     ideations.  He was seen in consultation by Dr. Rogers Blocker who made     medication recommendations for him.  He also recommended the     patient to be seen in inpatient behavioral health for further     evaluation and treatment.  The patient was initially had a sitter     at bedside due to the suicidal ideations.  However, after further     evaluation Dr. Rogers Blocker felt that the patient  no longer required     the sitter, this was removed.  Today, the patient has had no     verbalizations of suicidal deviations.  He has been cooperative and     is willing to go to behavioral health for further evaluation and     management. 2. Abdominal pain.  The patient was found to have acute cholecystitis     and gallstones in the neck of the duct.  The patient underwent     laparoscopic cholecystectomy by Dr. Jamey Ripa.  His diet was advanced.     Antibiotics were discontinued.  The patient has been afebrile with     significant decreasing abdominal pain. 3. Renal calculi.  The patient was found to have renal calculi with     mild lower pole hydronephrosis.  The patient's renal function has     stayed within the normal ranges and at the time of discharge to     behavioral health, the patient has a BUN of 6,  creatinine of 0.91.     Please note that the patient was not seen by Urology here in the     acute care hospital and I would recommend a Urology follow-up with     the patient for further evaluation of the renal calculi     particularly in light of the mild hydronephrosis.  The patient will     likely need to be followed and possibly medications given for the     stones or consideration of lithotripsy. 4. Major depression.  The patient is presently on Prozac and Seroquel,     had been on Remeron which was discontinued by Dr. Rogers Blocker.  He     would be transferred for further evaluation and management. 5. Tobacco use disorder.  The patient has been counseled against     further tobacco use.  He is in the precontemplative state.     However, has been rather cooperative here during this     hospitalization without any need for nicotine patch. 6. Hypertension.  The patient has borderline hypertension.  I am going     to recommend starting Norvasc 5 mg on the patient.  I would stay     away from any diuretics on this patient particular in light of his     renal calculi and I would also stay away from any ACE inhibitors on     the patient as if his renal function deteriorates, result of renal     calculi, it would be complicated by the ACE inhibitor.  Condition at the time of discharge is stable.  PHYSICAL EXAMINATION:  VITAL SIGNS:  Temperature is 97.7, heart rate 89, blood pressure 155/91, respiratory rate 18, O2 sats are 96% on room air. HEENT:  He is normocephalic, atraumatic.  Pupils are equally round and reactive to light and accommodation.  Extraocular movements are intact. Oropharynx is moist.  No extra erythema or lesions are noted. NECK:  Trachea is midline.  No masses, no thyromegaly, no JVD, no carotid bruit. LUNGS:  Clear to auscultation.  No wheezing or rhonchi noted. CARDIOVASCULAR:  He has got a normal S1 and S2.  No murmurs, rubs or gallops noted.  PMI is nondisplaced.  No  heaves or thrills on palpation. ABDOMEN:  Obese, soft, nontender, nondistended.  No masses.  No hepatosplenomegaly.  The patient has no CVA tenderness noted. EXTREMITIES:  No clubbing, cyanosis or edema.  DIETARY RESTRICTIONS:  The patient should be on a heart-healthy  diet.  PHYSICAL RESTRICTIONS:  None.  FOLLOWUP:  I am recommending a consult by Urology where the patient is in behavioral health to address the renal calculi and if there are any issues regarding his blood pressure, medical consult by the hospitalist and nursing home may be in order.  Total time for this process greater than 30 minutes.     Altha Harm, MD     MAM/MEDQ  D:  06/21/2010  T:  06/21/2010  Job:  [id number]  Electronically Signed by Marthann Schiller MD on 07/20/2010 07:59:18 PM

## 2010-07-27 NOTE — H&P (Signed)
NAME:  [name], [name] NO.:  [id number]  MEDICAL RECORD NO.:  [id number]           PATIENT TYPE:  LOCATION:                                 FACILITY:  PHYSICIAN:  Lonia Blood, M.D.      DATE OF BIRTH:  Sep 18, 1950  DATE OF ADMISSION:  06/17/2010 DATE OF DISCHARGE:                             HISTORY & PHYSICAL   PRIMARY CARE PHYSICIAN:  The patient is unassigned to [place].  PRESENTING COMPLAINT:  Suicidal ideation and abdominal pain.  HISTORY OF PRESENT ILLNESS:  The patient is a 60 year old homeless man who presented with suicidal ideation and depression.  He has been suicidal apparently for the last 3 years.  He was last admitted on April 18, 2010, to behavioral health with suicidal ideation.  He came out and apparently still homeless.  The patient said he is so fed up with all and he wanted to kill himself.  His method of choice is to stand in front of a moving truck so that it can ran over him.  He also complained of some nausea, vomiting, some chills, as well as abdominal pain.  Initial evaluation here showed that he had a large gallstone stuck at the gallbladder neck with findings consistent with acute cholecystitis.  The patient is originally from [place] apparently. He was doing better and he was in [place] and took away his medications.  PAST MEDICAL HISTORY:  Significant for: 1. Depression and suicidal ideations and attempts. 2. History of hepatitis C. 3. History of recurrent kidney stones. 4. UTI.  ALLERGIES:  He has no known drug allergies.  MEDICATIONS:  He is currently on none but he was given Prozac and Seroquel during last hospitalization in January.  SOCIAL HISTORY:  The patient is currently homeless.  He smokes about one pack per day.  He denied alcohol intake, denied any IV drug use.  FAMILY HISTORY:  Denied any significant family history for any disease.  REVIEW OF SYSTEMS:  The patient is completely depressed,  withdrawn, otherwise, all systems reviewed are currently negative except per HPI. He does have nausea and vomiting as indicated, otherwise.  PHYSICAL EXAMINATION:  VITAL SIGNS:  Temperature 97.7, blood pressure 124/71, his pulse 81, respiratory rate 18, sats 100% on room air. GENERAL:  The patient looks unkempt, quiet, withdrawn, but in no acute distress. HEENT:  PERRL.  EOMI.  No pallor, no jaundice.  No rhinorrhea. NECK:  Supple.  No JVD, no lymphadenopathy. RESPIRATORY:  He has good air entry bilaterally.  No wheezes, no rales, no crackles. CARDIOVASCULAR:  He has S1 and S2.  No murmur. ABDOMEN:  Soft full with positive tenderness, especially in the right upper quadrant epigastric region.  No guarding. EXTREMITIES:  No edema, cyanosis, or clubbing. SKIN:  No rashes.  No ulcers.  LABORATORY DATA:  His white count is 5.4, hemoglobin 13.7, platelet count 154 with normal differential.  Sodium 137, potassium 3.7, chloride 104, CO2 is 25, glucose 100, BUN 10, creatinine 0.75.  His LFTs are elevated with albumin of 3.0, total protein of 6.2, calcium 8.7.  His total bilirubin is 4.1, alkaline phosphatase 286, AST  351 and ALT of 847.  Alcohol level is less than 5.  Urine drug screen is negative. Urinalysis showed only small leukocyte esterase.  Urine microscopy showed WBC 3-6 with rare bacteria.  His lipase is 36.  Chest x-ray showed no acute findings. CT abdomen and pelvis showed an 8-mm gallstone lodged in the gallbladder neck or cystic duct with no distinctness around the gallbladder. Findings are concerning for acute cholecystitis.  There is also multiple nonobstructing left lower pole renal calculi, the largest about 9 mm.  ASSESSMENT:  This is a 60 year old homeless man presenting with what appears to be acute cholecystitis and also suicidal ideation.  PLAN: 1. Acute cholecystitis.  We will admit the patient, start him on IV     Cipro, Flagyl.  Hydrate the patient.  Surgery has  been contacted     and will only see the patient when he is stable.  We will try and     get GI consult in the morning.  In the meantime, pain control,     nausea control will continue. 2. Suicidal ideation.  We will put the patient on suicide precaution     and get psychiatric consult as soon as possible.  The patient may     need to go to inpatient psych after his medical treatment. 3. Severe depression.  The patient has been off his medications and     will resume them as soon as possible or as soon as he is seen by     Psychiatry. 4. Tobacco abuse.  I will offer nicotine patch and continue with     tobacco cessation counseling. 5. Possible UTI.  Again, I will put him on Cipro, Flagyl; the Cipro     should be able to take care of possible UTI.  We will await urine     culture and sensitivities. 6. Homelessness.  We will get social worker consult as well while he     is in the hospital. 7. History hepatitis C.  This seems to be stable and will need to be     for followed up mainly as an outpatient.     Lonia Blood, M.D.     Verlin Grills  D:  06/17/2010  T:  06/17/2010  Job:  [id number]  Electronically Signed by Lonia Blood M.D. on 07/27/2010 03:34:08 PM

## 2010-07-30 ENCOUNTER — Emergency Department (HOSPITAL_COMMUNITY)
Admission: EM | Admit: 2010-07-30 | Discharge: 2010-07-31 | Disposition: A | Discharge status: Home / Self Care | Payer: Medicare Other | Attending: Emergency Medicine | Admitting: Emergency Medicine

## 2010-07-30 DIAGNOSIS — Z5900 Homelessness unspecified: Secondary | ICD-10-CM | POA: Insufficient documentation

## 2010-07-30 DIAGNOSIS — N200 Calculus of kidney: Secondary | ICD-10-CM | POA: Insufficient documentation

## 2010-07-30 DIAGNOSIS — R109 Unspecified abdominal pain: Secondary | ICD-10-CM | POA: Insufficient documentation

## 2010-07-30 DIAGNOSIS — R062 Wheezing: Secondary | ICD-10-CM | POA: Insufficient documentation

## 2010-07-30 DIAGNOSIS — N390 Urinary tract infection, site not specified: Secondary | ICD-10-CM | POA: Insufficient documentation

## 2010-07-30 DIAGNOSIS — Z8619 Personal history of other infectious and parasitic diseases: Secondary | ICD-10-CM | POA: Insufficient documentation

## 2010-07-31 ENCOUNTER — Emergency Department (HOSPITAL_COMMUNITY): Payer: Medicare Other

## 2010-07-31 LAB — URINALYSIS, ROUTINE W REFLEX MICROSCOPIC
Bilirubin Urine: NEGATIVE
Glucose, UA: NEGATIVE mg/dL
Ketones, ur: NEGATIVE mg/dL
Nitrite: NEGATIVE
Specific Gravity, Urine: 1.022 (ref 1.005–1.030)
pH: 5.5 (ref 5.0–8.0)

## 2010-07-31 LAB — URINE MICROSCOPIC-ADD ON

## 2010-07-31 LAB — GC/CHLAMYDIA PROBE AMP, GENITAL
Chlamydia, DNA Probe: NEGATIVE
GC Probe Amp, Genital: NEGATIVE

## 2010-08-01 LAB — URINE CULTURE: Colony Count: 75000

## 2011-05-16 ENCOUNTER — Encounter (HOSPITAL_COMMUNITY): Payer: Self-pay | Admitting: Emergency Medicine

## 2011-05-16 ENCOUNTER — Emergency Department (HOSPITAL_COMMUNITY)
Admission: EM | Admit: 2011-05-16 | Discharge: 2011-05-17 | Disposition: A | Discharge status: Home / Self Care | Payer: Medicare Other | Attending: Emergency Medicine | Admitting: Emergency Medicine

## 2011-05-16 DIAGNOSIS — F17200 Nicotine dependence, unspecified, uncomplicated: Secondary | ICD-10-CM | POA: Insufficient documentation

## 2011-05-16 DIAGNOSIS — M25559 Pain in unspecified hip: Secondary | ICD-10-CM | POA: Insufficient documentation

## 2011-05-16 DIAGNOSIS — F3289 Other specified depressive episodes: Secondary | ICD-10-CM | POA: Insufficient documentation

## 2011-05-16 DIAGNOSIS — R45851 Suicidal ideations: Secondary | ICD-10-CM | POA: Insufficient documentation

## 2011-05-16 DIAGNOSIS — F411 Generalized anxiety disorder: Secondary | ICD-10-CM | POA: Insufficient documentation

## 2011-05-16 DIAGNOSIS — F32A Depression, unspecified: Secondary | ICD-10-CM

## 2011-05-16 DIAGNOSIS — J45909 Unspecified asthma, uncomplicated: Secondary | ICD-10-CM | POA: Insufficient documentation

## 2011-05-16 DIAGNOSIS — F329 Major depressive disorder, single episode, unspecified: Secondary | ICD-10-CM

## 2011-05-16 HISTORY — DX: Depression, unspecified: F32.A

## 2011-05-16 HISTORY — DX: Anxiety disorder, unspecified: F41.9

## 2011-05-16 HISTORY — DX: Major depressive disorder, single episode, unspecified: F32.9

## 2011-05-16 LAB — COMPREHENSIVE METABOLIC PANEL
AST: 61 U/L — ABNORMAL HIGH (ref 0–37)
CO2: 24 mEq/L (ref 19–32)
Calcium: 9.4 mg/dL (ref 8.4–10.5)
Creatinine, Ser: 0.71 mg/dL (ref 0.50–1.35)
GFR calc Af Amer: >90 mL/min (ref >90)
GFR calc non Af Amer: >90 mL/min (ref >90)

## 2011-05-16 LAB — RAPID URINE DRUG SCREEN, HOSP PERFORMED
Cocaine: NOT DETECTED
Opiates: POSITIVE — AB
Tetrahydrocannabinol: POSITIVE — AB

## 2011-05-16 LAB — CBC
MCH: 30.5 pg (ref 26.0–34.0)
MCV: 86.7 fL (ref 78.0–100.0)
Platelets: 197 10*3/uL (ref 150–400)
RBC: 4.75 MIL/uL (ref 4.22–5.81)
RDW: 14.5 % (ref 11.5–15.5)

## 2011-05-16 LAB — ETHANOL: Alcohol, Ethyl (B): <11 mg/dL (ref 0–11)

## 2011-05-16 MED ORDER — ACETAMINOPHEN 325 MG PO TABS: 650.0000 mg | ORAL_TABLET | ORAL | Status: DC | PRN

## 2011-05-16 MED ORDER — ACETAMINOPHEN 325 MG PO TABS: 650.0000 mg | ORAL_TABLET | Freq: Once | ORAL | Status: DC

## 2011-05-16 MED ORDER — LORAZEPAM 1 MG PO TABS
1.0000 mg | ORAL_TABLET | Freq: Three times a day (TID) | ORAL | Status: DC | PRN
  Administered 2011-05-16: 1 mg via ORAL
  Filled 2011-05-16: qty 1

## 2011-05-16 MED ORDER — ONDANSETRON HCL 4 MG PO TABS: 4.0000 mg | ORAL_TABLET | Freq: Three times a day (TID) | ORAL | Status: DC | PRN

## 2011-05-16 NOTE — ED Provider Notes (Signed)
History     CSN: [id number]  Arrival date & time 05/16/11  6578    Chief Complaint  Patient presents with  . Hip Pain    r/hip pain radiating to knee x 2 days. Hx hip replacement  . Suicidal    Pt stated "SI " when assessed by PA. Not stated to this RN    HPI Pt was seen at 1130.  Per pt, c/o gradual onset and worsening of persistent depression and SI for the past several days.  Pt states he plans to "OD on street drugs, like heroin."  Denies SA, no HI.   Past Medical History  Diagnosis Date  . Asthma   . Anxiety   . Depression     Past Surgical History  Procedure Date  . Hip surgery     replacement -right    History  Substance Use Topics  . Smoking status: Current Everyday Smoker  . Smokeless tobacco: Not on file  . Alcohol Use: No    Review of Systems ROS: Statement: All systems negative except as marked or noted in the HPI; Constitutional: Negative for fever and chills. ; ; Eyes: Negative for eye pain, redness and discharge. ; ; ENMT: Negative for ear pain, hoarseness, nasal congestion, sinus pressure and sore throat. ; ; Cardiovascular: Negative for chest pain, palpitations, diaphoresis, dyspnea and peripheral edema. ; ; Respiratory: Negative for cough, wheezing and stridor. ; ; Gastrointestinal: Negative for nausea, vomiting, diarrhea, abdominal pain, blood in stool, hematemesis, jaundice and rectal bleeding. . ; ; Genitourinary: Negative for dysuria, flank pain and hematuria. ; ; Musculoskeletal: Negative for back pain and neck pain. Negative for swelling and trauma.; ; Skin: Negative for pruritus, rash, abrasions, blisters, bruising and skin lesion.; ; Neuro: Negative for headache, lightheadedness and neck stiffness. Negative for weakness, altered level of consciousness , altered mental status, extremity weakness, paresthesias, involuntary movement, seizure and syncope.; Psych:  +SI, no SA, no HI.     Allergies  Review of patient's allergies indicates no known  allergies.  Home Medications  No current outpatient prescriptions on file.  BP 110/69  Pulse 76  Temp(Src) 97.8 F (36.6 C) (Oral)  Resp 16  Ht 5\' 6"  (1.676 m)  SpO2 97%  Physical Exam 1135: Physical examination:  Nursing notes reviewed; Vital signs and O2 SAT reviewed;  Constitutional: Well developed, Well nourished, Well hydrated, In no acute distress; Head:  Normocephalic, atraumatic; Eyes: EOMI, PERRL, No scleral icterus; ENMT: Mouth and pharynx normal, Mucous membranes moist; Neck: Supple, Full range of motion, No lymphadenopathy; Cardiovascular: Regular rate and rhythm, No murmur, rub, or gallop; Respiratory: Breath sounds clear & equal bilaterally, No rales, rhonchi, wheezes, or rub, Normal respiratory effort/excursion; Chest: Nontender, Movement normal; Abdomen: Soft, Nontender, Nondistended, Normal bowel sounds; Extremities: Pulses normal, No tenderness, No edema, No calf edema or asymmetry.; Neuro: AA&Ox3, Major CN grossly intact.  No gross focal motor or sensory deficits in extremities.; Skin: Color normal, Warm, Dry; Psych:  Affect flat, poor eye contact, +SI.    ED Course  Procedures   MDM  MDM Reviewed: nursing note and vitals Interpretation: labs   Results for orders placed during the hospital encounter of 05/16/11  CBC      Component Value Range   WBC 9.3  4.0 - 10.5 (K/uL)   RBC 4.75  4.22 - 5.81 (MIL/uL)   Hemoglobin 14.5  13.0 - 17.0 (g/dL)   HCT 46.9  62.9 - 52.8 (%)   MCV 86.7  78.0 -  100.0 (fL)   MCH 30.5  26.0 - 34.0 (pg)   MCHC 35.2  30.0 - 36.0 (g/dL)   RDW 16.1  09.6 - 04.5 (%)   Platelets 197  150 - 400 (K/uL)  COMPREHENSIVE METABOLIC PANEL      Component Value Range   Sodium 130 (*) 135 - 145 (mEq/L)   Potassium 4.1  3.5 - 5.1 (mEq/L)   Chloride 99  96 - 112 (mEq/L)   CO2 24  19 - 32 (mEq/L)   Glucose, Bld 119 (*) 70 - 99 (mg/dL)   BUN 8  6 - 23 (mg/dL)   Creatinine, Ser 4.09  0.50 - 1.35 (mg/dL)   Calcium 9.4  8.4 - 81.1 (mg/dL)   Total  Protein 8.1  6.0 - 8.3 (g/dL)   Albumin 3.2 (*) 3.5 - 5.2 (g/dL)   AST 61 (*) 0 - 37 (U/L)   ALT 62 (*) 0 - 53 (U/L)   Alkaline Phosphatase 156 (*) 39 - 117 (U/L)   Total Bilirubin 0.5  0.3 - 1.2 (mg/dL)   GFR calc non Af Amer >90  >90 (mL/min)   GFR calc Af Amer >90  >90 (mL/min)  ETHANOL      Component Value Range   Alcohol, Ethyl (B) <11  0 - 11 (mg/dL)  URINE RAPID DRUG SCREEN (HOSP PERFORMED)      Component Value Range   Opiates POSITIVE (*) NONE DETECTED    Cocaine NONE DETECTED  NONE DETECTED    Benzodiazepines NONE DETECTED  NONE DETECTED    Amphetamines NONE DETECTED  NONE DETECTED    Tetrahydrocannabinol POSITIVE (*) NONE DETECTED    Barbiturates NONE DETECTED  NONE DETECTED      1200:  ACT to eval.  Holding orders written.          Laray Anger, DO 05/17/11 1015

## 2011-05-16 NOTE — ED Notes (Signed)
Transported to [facility] 3 white belonging bags and one blue duffel bag  Labeled and presented to RN in [facility] Pt wanded by security prior to transport

## 2011-05-16 NOTE — ED Provider Notes (Signed)
Pt presented to the ED with complaints of right hip pain that shoots down towards the knee. He states that he had a hip replacement surgery done twenty years ago. Pt has not tried any medication for the pain. After my physical exam, I offered patient Tylenol and some prednisone for the pain and then the patient stated that he would like to be committed. He states that he is hearing voices and seeing things. He states that he is suicidal with a plan. His plan is to overdose on drugs. He denies being homicidal.  Pt confirmed multiple times that he is suicidal. THen when I begin to leave the room he states that he "takes it back, he is not suicidal" he states " that he needs to go outside and get some air". Security called to monitor pt, pt changed to a level 2 for further evaluation.   Dorthula Matas, PA 05/16/11 1010  Dorthula Matas, [place] 05/16/11 1010

## 2011-05-16 NOTE — ED Notes (Signed)
Sleeping after Ativan given.

## 2011-05-16 NOTE — ED Notes (Signed)
Pt is resting, pts comments appropriate. Waiting on MD assess

## 2011-05-16 NOTE — ED Notes (Signed)
Care assumed

## 2011-05-16 NOTE — ED Notes (Signed)
Patient is resting comfortably. Pt remains cooperative

## 2011-05-16 NOTE — ED Notes (Signed)
C/o anxiety, medicated 

## 2011-05-16 NOTE — BH Assessment (Addendum)
Assessment Note   [name] is an 61 y.o. male. Pt presents to [facility] with complaints of suicidal thoughts. Sts he has a plan to overdose on drugs. He has a prior history of suicide by running into traffic and cutting wrist; both incidences were 20-30 yrs ago. Patient sts he is unable to contract for safety. He denies HI. He reports AVH's. He has visions of a people running in front of him. He hears voices telling him, "Go kill yourself...you ain't no good". Patient reports symptoms of depression, suicidal thoughts, and AVH's for the past 6-7 months. Patient reports daily use of THC since 17. Recently he started taking pain pills, purchased off the street. He reports 2-3 days of opiate use; no history. UDS positive for both THC and opiates.    Axis I: Major Depression, Recurrent severe with psychotic features; Cannibus Abuse Axis II: Deferred Axis III:  Past Medical History  Diagnosis Date  . Asthma   . Anxiety   . Depression    Axis IV: economic problems, housing problems, occupational problems, other psychosocial or environmental problems, problems related to social environment, problems with access to health care services and problems with primary support group Axis V: 31-40 impairment in reality testing  Past Medical History:  Past Medical History  Diagnosis Date  . Asthma   . Anxiety   . Depression     Past Surgical History  Procedure Date  . Hip surgery     replacement -right    Family History: History reviewed. No pertinent family history.  Social History:  reports that he has been smoking.  He does not have any smokeless tobacco history on file. He reports that he uses illicit drugs (Marijuana and Oxycodone). He reports that he does not drink alcohol.  Additional Social History:  Alcohol / Drug Use Pain Medications: patient denies Prescriptions: see listed meds noted by ED staff Over the Counter: patient denies History of alcohol / drug use?: Yes Substance  #1 Name of Substance 1: THC 1 - Age of First Use: 17 1 - Amount (size/oz): 1x per day 1 - Frequency: daily 1 - Duration: "couple of joints per day" 1 - Last Use / Amount: 05/14/2011 Substance #2 Name of Substance 2: opiates (purchased off the street) 2 - Age of First Use: 17 2 - Amount (size/oz): 3 pills per day 2 - Frequency: past 2-3 days 2 - Duration: past 2-3 days 2 - Last Use / Amount: 05/14/2011 Allergies: No Known Allergies  Home Medications:  Medications Prior to Admission  Medication Dose Route Frequency Provider Last Rate Last Dose  . acetaminophen (TYLENOL) tablet 650 mg  650 mg Oral Once Laray Anger, DO      . acetaminophen (TYLENOL) tablet 650 mg  650 mg Oral Q4H PRN Glynn Octave, MD      . LORazepam (ATIVAN) tablet 1 mg  1 mg Oral Q8H PRN Glynn Octave, MD   1 mg at 05/16/11 1520  . ondansetron (ZOFRAN) tablet 4 mg  4 mg Oral Q8H PRN Glynn Octave, MD       No current outpatient prescriptions on file as of 05/16/2011.    OB/GYN Status:  No LMP for male patient.  General Assessment Data Location of Assessment: [facility] ED ACT Assessment: Yes Living Arrangements: Homeless Can pt return to current living arrangement?: Yes Admission Status: Voluntary Is patient capable of signing voluntary admission?: Yes Transfer from: Acute Hospital Referral Source: Self/Family/Friend  Education Status Is patient currently in school?: No  Risk to self Suicidal Ideation: Yes-Currently Present Suicidal Intent: Yes-Currently Present Is patient at risk for suicide?: Yes Suicidal Plan?: Yes-Currently Present Specify Current Suicidal Plan:  (overdose on drugs) Access to Means: Yes Specify Access to Suicidal Means:  (access to drugs; able to buy drugs on street) What has been your use of drugs/alcohol within the last 12 months?:  (THC and opiates-pain pills) Previous Attempts/Gestures: Yes How many times?:  (2x's: approx. 20 yrs ago overdose; 54yrs ago cut self) Other Self  Harm Risks:  (none reported) Triggers for Past Attempts:  (family issues) Intentional Self Injurious Behavior: None (no current episodes; attempted to cut self) Family Suicide History: No Recent stressful life event(s): Other (Comment);Financial Problems (homeless, conflict w/ friend,  and no support) Persecutory voices/beliefs?: No Depression: Yes Depression Symptoms: Isolating;Fatigue;Loss of interest in usual pleasures;Feeling worthless/self pity;Feeling angry/irritable Substance abuse history and/or treatment for substance abuse?: No Suicide prevention information given to non-admitted patients: Not applicable  Risk to Others Homicidal Ideation: No Thoughts of Harm to Others: No Current Homicidal Intent: No Current Homicidal Plan: No Access to Homicidal Means: No Identified Victim:  (n/a) History of harm to others?: No Assessment of Violence: None Noted Violent Behavior Description:  (n/a) Does patient have access to weapons?: No Criminal Charges Pending?: No Does patient have a court date: No  Pt reports both aud. And vis. Hallucinatons. Sts he hears voices telling him "kill himself". He has visions of people running toward him.   Mental Status Report Appear/Hygiene: Disheveled;Body odor;Poor hygiene Eye Contact: Poor Motor Activity: Unremarkable Speech: Logical/coherent Level of Consciousness: Alert Mood: Depressed;Anxious Affect: Unable to Assess;Depressed (flat) Anxiety Level: Minimal Thought Processes: Circumstantial Judgement: Impaired Orientation: Person;Place;Time;Situation Obsessive Compulsive Thoughts/Behaviors: None  Cognitive Functioning Concentration: Decreased Memory: Remote Intact;Recent Intact IQ: Average Insight: Poor Impulse Control: Fair Appetite: Fair Weight Loss:  (20 Ibs in 7 months) Weight Gain:  (n/a) Sleep: Decreased Total Hours of Sleep:  (1 hr per night) Vegetative Symptoms: None  Prior Inpatient Therapy Prior Inpatient Therapy:  Yes Prior Therapy Dates:  (7-8 months ago) Prior Therapy Facilty/Provider(s):  [facility]) Reason for Treatment:  (AVH's)  Prior Outpatient Therapy Prior Outpatient Therapy: No Prior Therapy Dates:  (n/s) Prior Therapy Facilty/Provider(s):  (n/a) Reason for Treatment:  (n/a)  ADL Screening (condition at time of admission) Patient's cognitive ability adequate to safely complete daily activities?: Yes Patient able to express need for assistance with ADLs?: Yes Independently performs ADLs?: Yes Weakness of Legs: None Weakness of Arms/Hands: None  Home Assistive Devices/Equipment Home Assistive Devices/Equipment: None  Therapy Consults (therapy consults require a physician order) PT Evaluation Needed: No OT Evalulation Needed: No SLP Evaluation Needed: No Abuse/Neglect Assessment (Assessment to be complete while patient is alone) Physical Abuse: Denies Verbal Abuse: Denies Sexual Abuse: Denies Exploitation of patient/patient's resources: Denies Self-Neglect: Denies Values / Beliefs Cultural Requests During Hospitalization: None Spiritual Requests During Hospitalization: None   Advance Directives (For Healthcare) Advance Directive: Patient does not have advance directive Nutrition Screen Diet: Regular Unintentional weight loss greater than 10lbs within the last month: No (pt reports wt. gain of 20 Ibs only) Dysphagia: No Home Tube Feeding or Total Parenteral Nutrition (TPN): No Patient appears severely malnourished: No Pregnant or Lactating: No Dietitian Consult Needed: No  Additional Information 1:1 In Past 12 Months?: No CIRT Risk: No Elopement Risk: No Does patient have medical clearance?: Yes     Disposition:  Disposition Disposition of Patient: Other dispositions (Disposition pending telepsych consult) Other disposition(s): Information only (disposition pending telepsych consult)  On Site Evaluation by:   Reviewed with Physician:     Melynda Ripple  [facility] 05/16/2011 4:53 PM

## 2011-05-16 NOTE — ED Notes (Signed)
MD at bedside. 

## 2011-05-16 NOTE — BH Assessment (Signed)
Patient requesting to leave stating that he is no long suicidal. Clinical research associate spoke with Dr. Brooke Dare in regards to having patient complete a telepsych. He agreed and Clinical research associate initiated the telepsych by completing the appropriate paperwork.

## 2011-05-16 NOTE — ED Notes (Signed)
Pt denies fall. Did not attempt to tx with OTC meds

## 2011-05-17 NOTE — BHH Counselor (Signed)
Per shift report, patient to be discharged home based on the recommendations of the telepsych consult. Writer informed the EDP--Dr. Thereasa Solo of patients disposition and he agreed to discharge patient. Pt's nurse-Sheila made aware of patients discharge home. Writer discussed referrals with patient in length. Patient made aware of various programs that he may follow up with including [facility], [facility], [facility] [place], [employer], etc. Patient given a copy of all referral information.

## 2011-05-17 NOTE — ED Provider Notes (Signed)
Pt is no longer a threat to himself or anyone.  Psych md stated pt can go home.  Pt wants to stay over night  Benny Lennert, MD 05/17/11 [phone]

## 2011-05-17 NOTE — ED Provider Notes (Signed)
Medical screening examination/treatment/procedure(s) were conducted as a shared visit with non-physician practitioner(s) and myself.  I personally evaluated the patient during the encounter Please see my separate note.   Laray Anger, DO 05/17/11 1017

## 2011-05-17 NOTE — ED Provider Notes (Signed)
Patient is no longer suicidal.  No issues overnight.  Resting comfortably this morning.  Is suitable for discharge this morning.  Geoffery Lyons, MD 05/17/11 [phone]

## 2012-02-27 ENCOUNTER — Emergency Department (HOSPITAL_COMMUNITY)
Admission: EM | Admit: 2012-02-27 | Discharge: 2012-02-27 | Discharge status: Against Medical Advice | Payer: Medicare Other | Attending: Emergency Medicine | Admitting: Emergency Medicine

## 2012-02-27 ENCOUNTER — Encounter (HOSPITAL_COMMUNITY): Payer: Self-pay | Admitting: Emergency Medicine

## 2012-02-27 DIAGNOSIS — M549 Dorsalgia, unspecified: Secondary | ICD-10-CM | POA: Insufficient documentation

## 2012-02-27 NOTE — ED Notes (Signed)
Pt not in waiting room for first call- outside smoking

## 2012-02-27 NOTE — ED Notes (Signed)
Pt presenting to ed with c/o "I think I fell x 3 days ago". Pt states he's able to walk and per ems they picked pt up from but stop. Pt states he ambulates with a cane.

## 2012-03-12 IMAGING — CR DG CHEST 2V
2 series · 2 of 2 positions shown · non-contrast
Comparison: None.

CLINICAL DATA: Cough/abdominal pain

CHEST - 2 VIEW

[w chest pa]
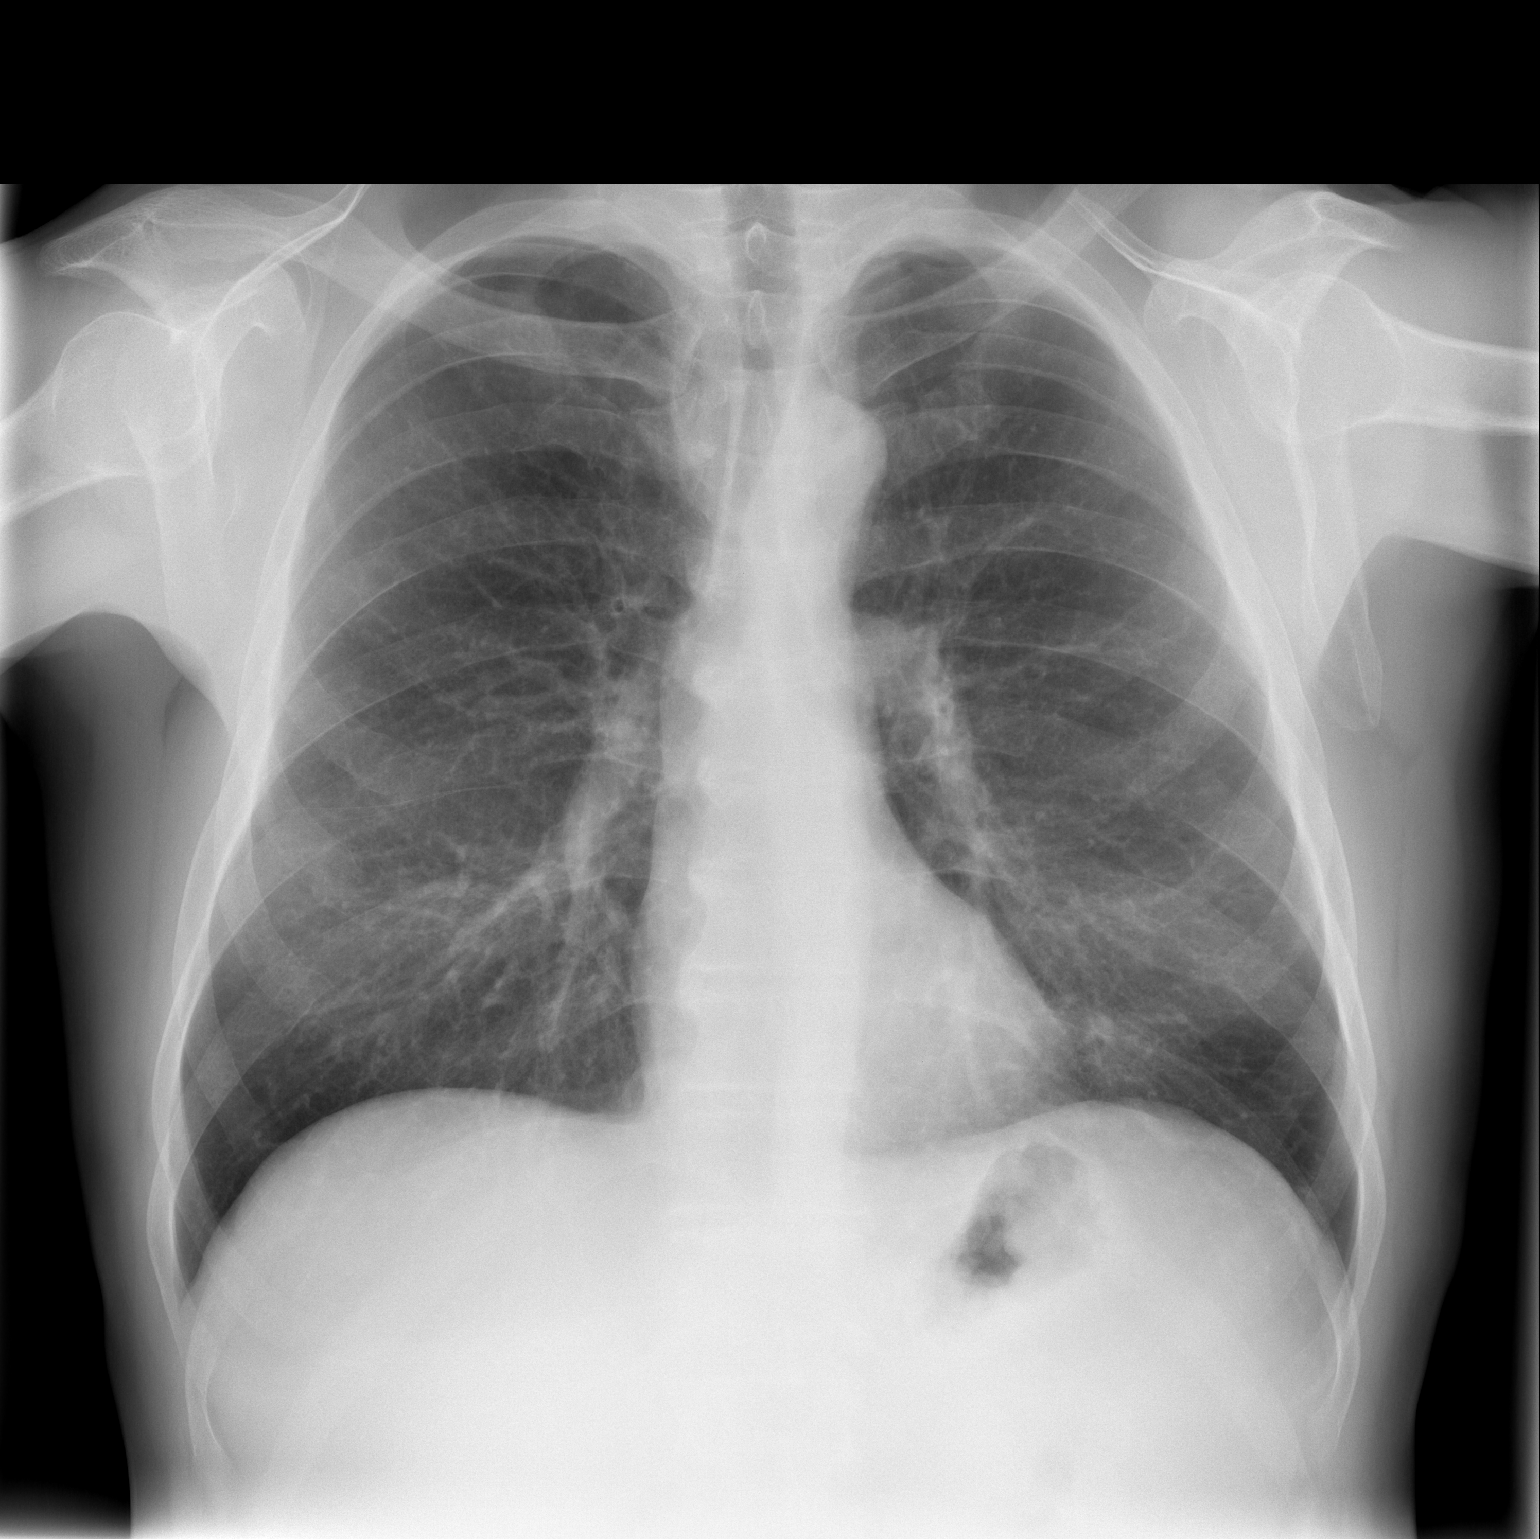

[w chest lat]
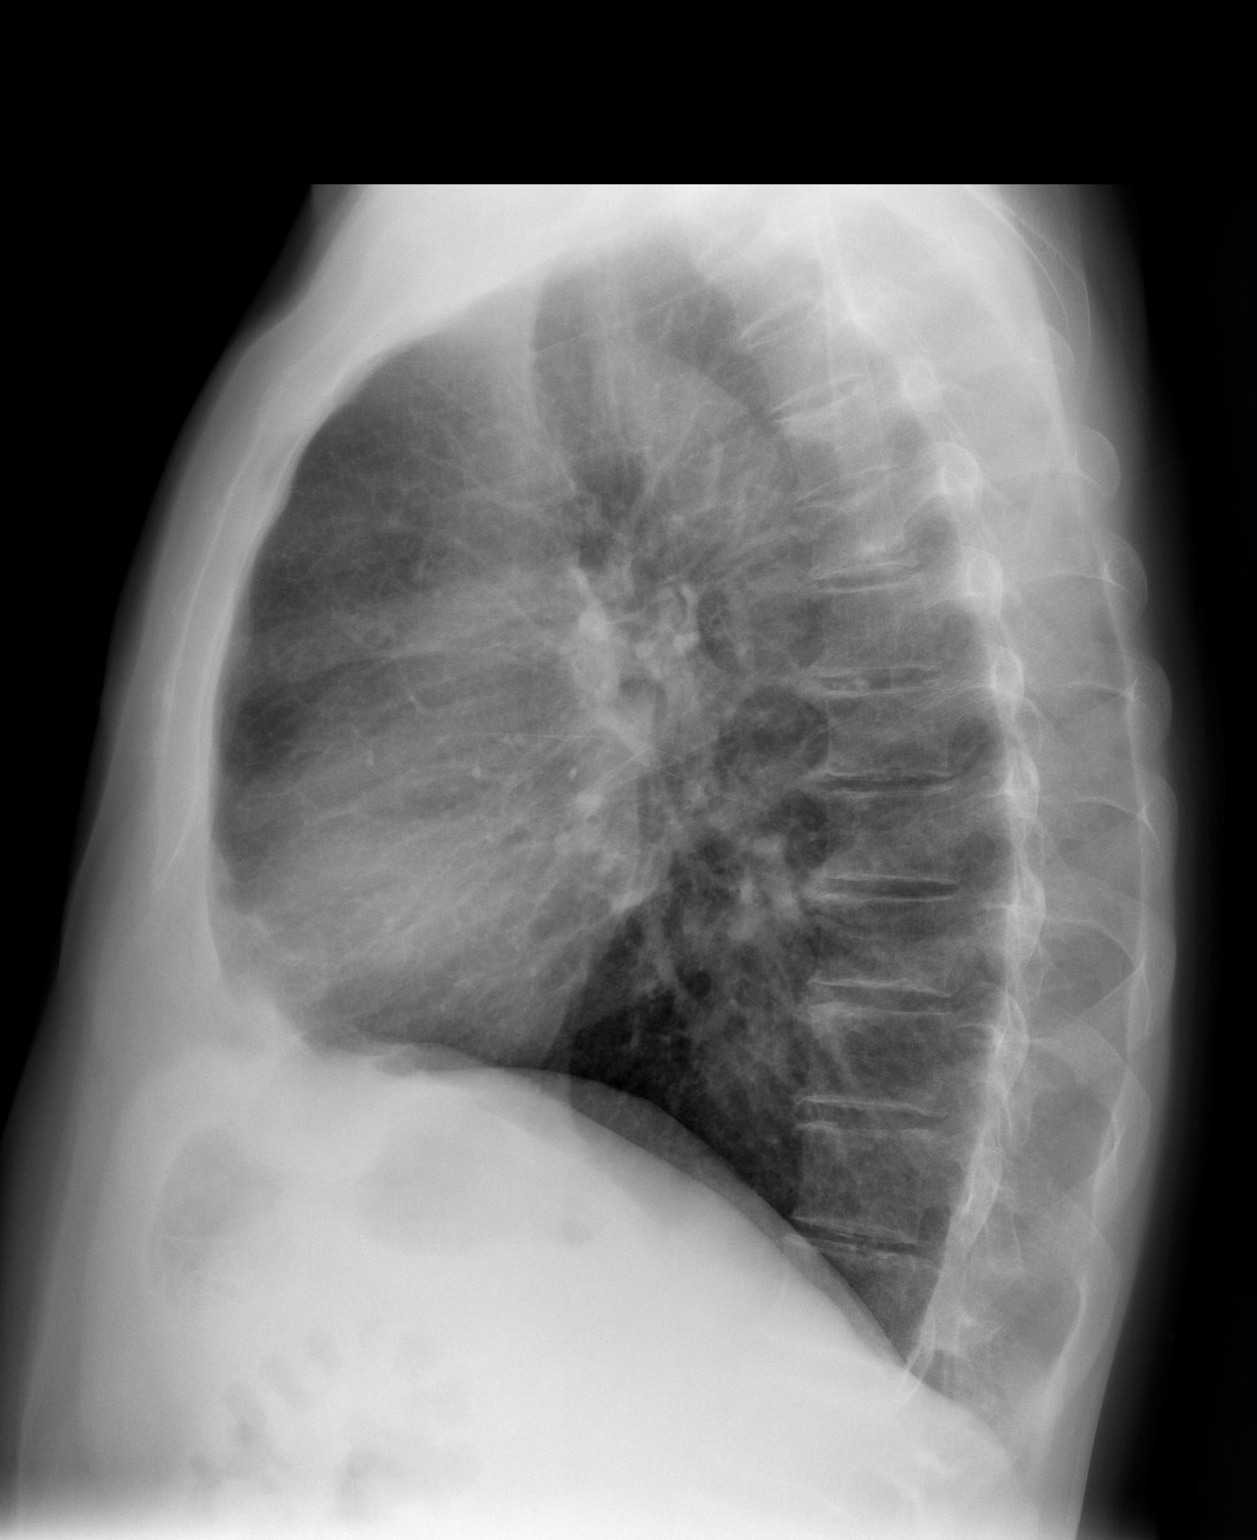

[2 of 2 positions shown; findings below may reference images not displayed]

FINDINGS: Heart size normal.  There is moderate central airway
thickening.  There is a small density adjacent to the cardiac apex
seen on the frontal view.  This could be scar, but without prior
films for comparison, a small lung nodule cannot be excluded.  I
cannot see this density on an abdominal film done in 8060.

The lungs are otherwise clear.  No pleural fluid or osseous
lesions.

If the patient has had outside chest x-rays, it would be worthwhile
trying to obtain them for comparison.  If none are available, CT of
the chest (with contrast if possible) is indicated for further
assessment.
IMPRESSION: 1.  Central airway thickening consistent with chronic bronchitis.
2.  Small density adjacent to the left cardiac apex.  Significance
unknown without prior films for comparison.  See above discussion.

## 2012-03-13 IMAGING — US US ABDOMEN COMPLETE
1 series · 13 of 25 positions shown · non-contrast
Comparison: CT abdomen 06/16/2010.

CLINICAL DATA: Abdominal pain.

COMPLETE ABDOMINAL ULTRASOUND

[Series 1: us abdomen complete · 0.31mm/px · 13 of 88 slices shown]
[im 1/88]
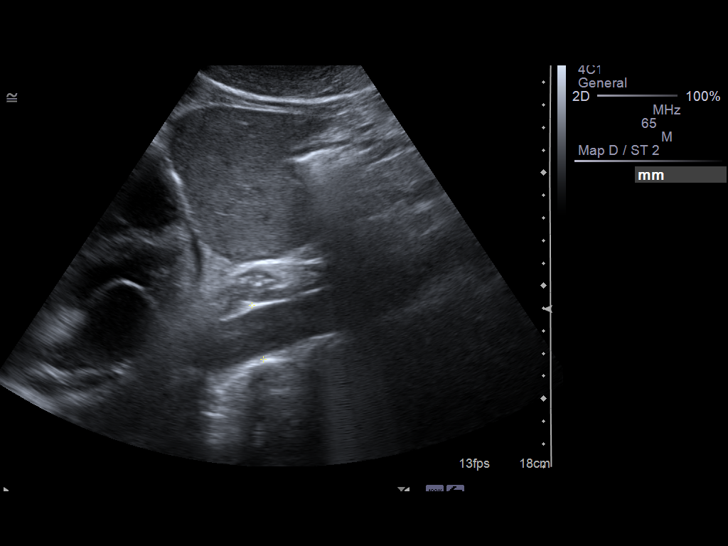
[im 8/88]
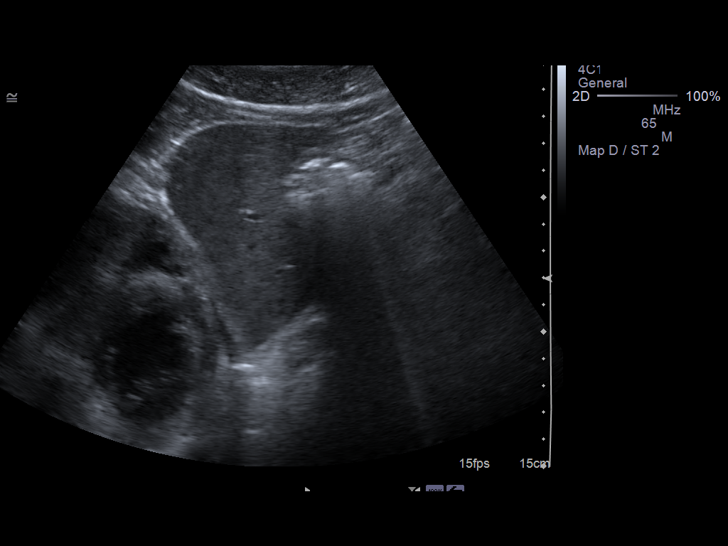
[im 15/88]
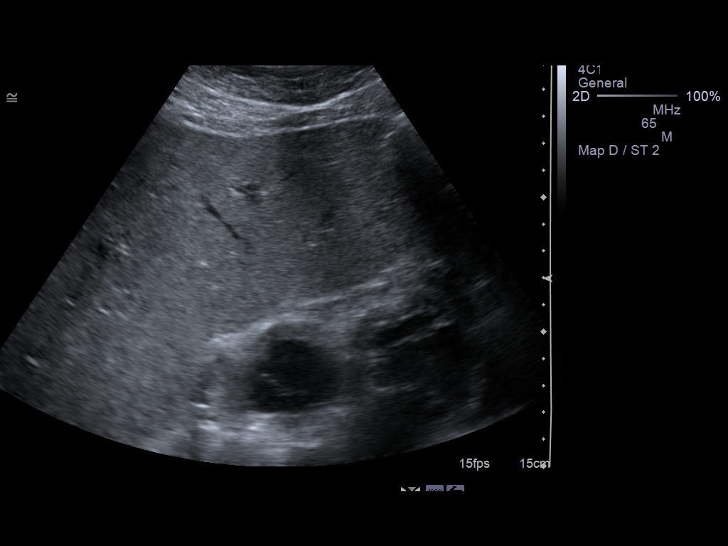
[im 22/88]
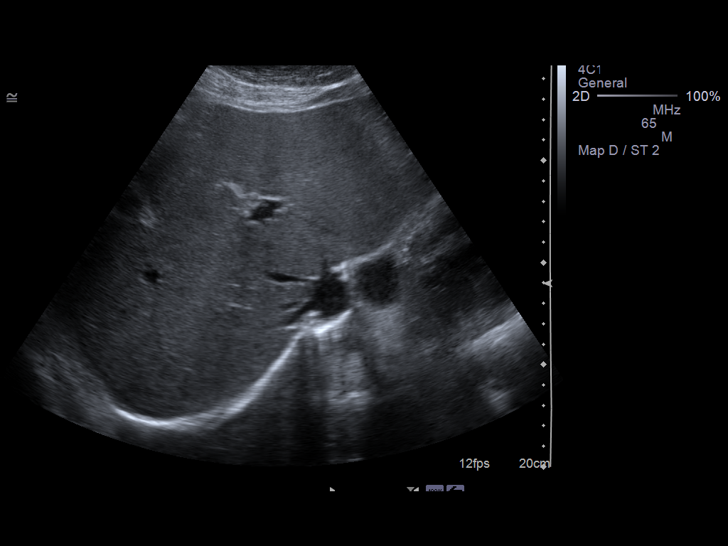
[im 30/88]
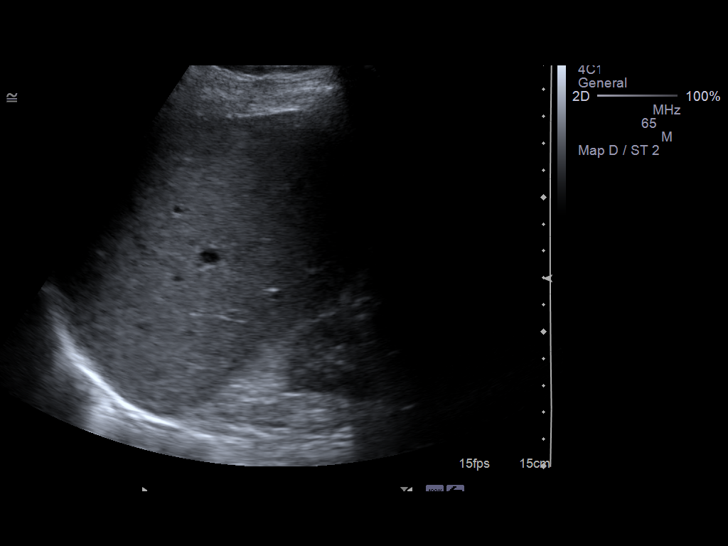
[im 37/88]
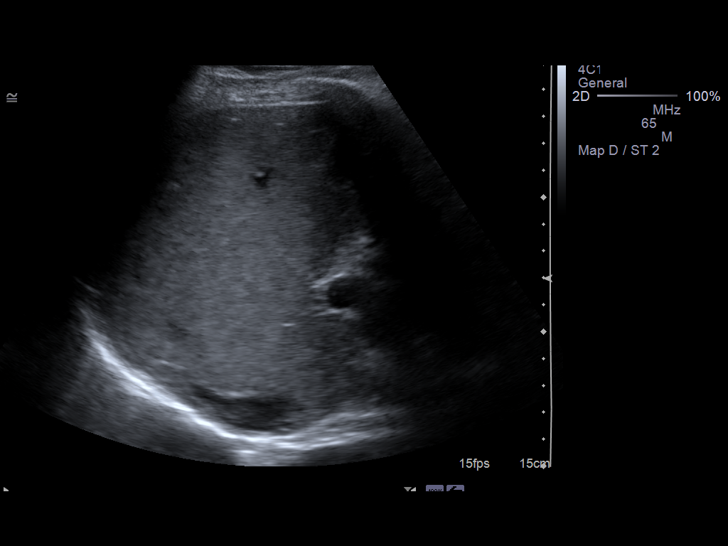
[im 44/88]
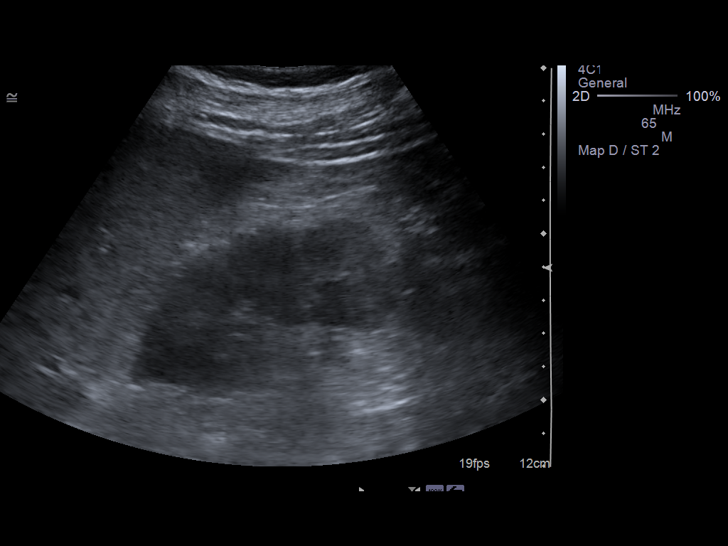
[im 51/88]
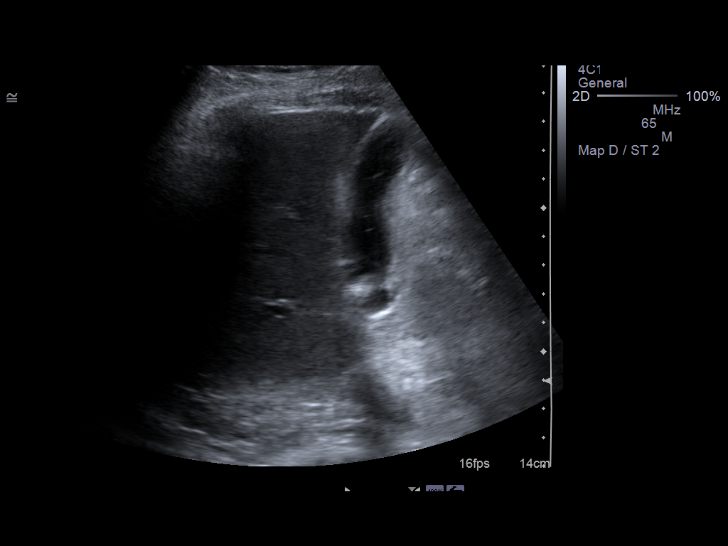
[im 59/88]
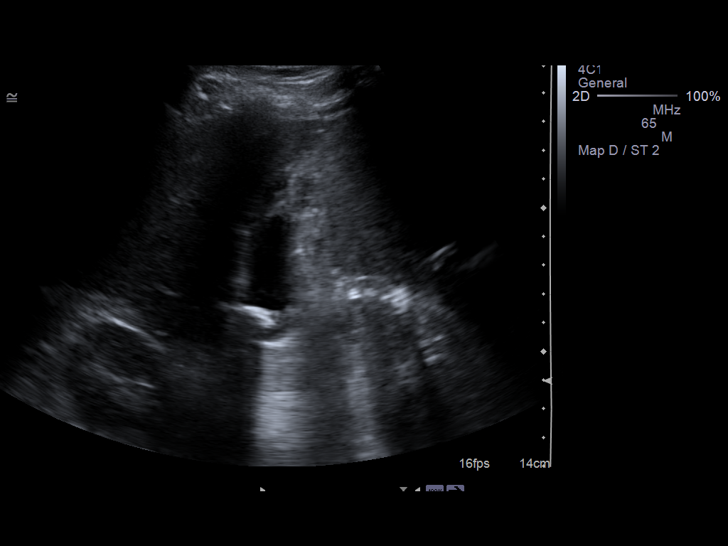
[im 66/88]
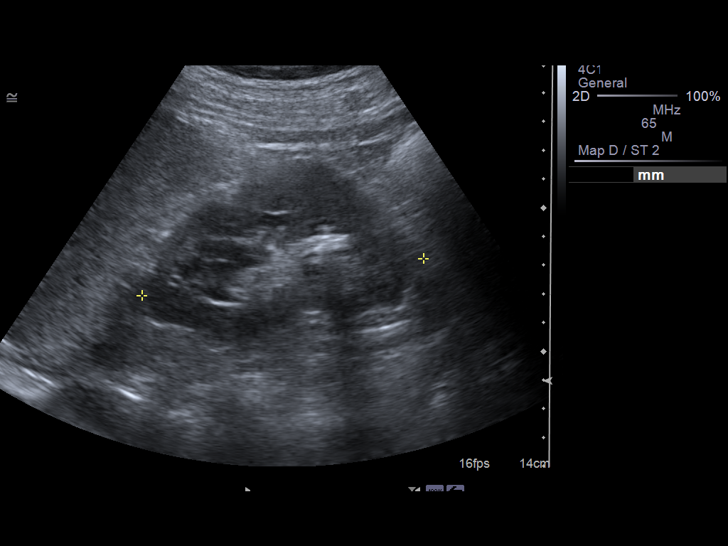
[im 73/88]
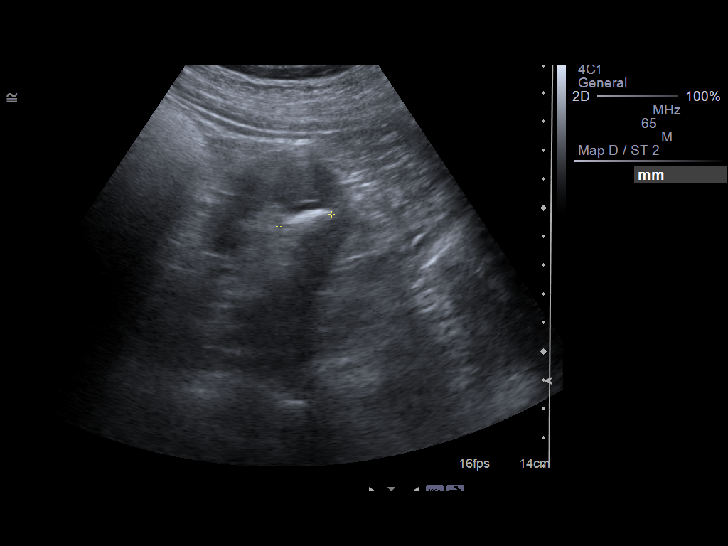
[im 80/88]
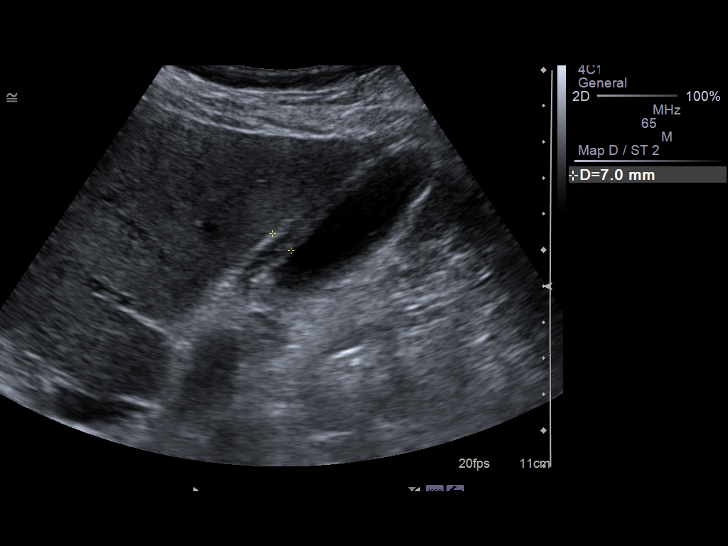
[im 88/88]
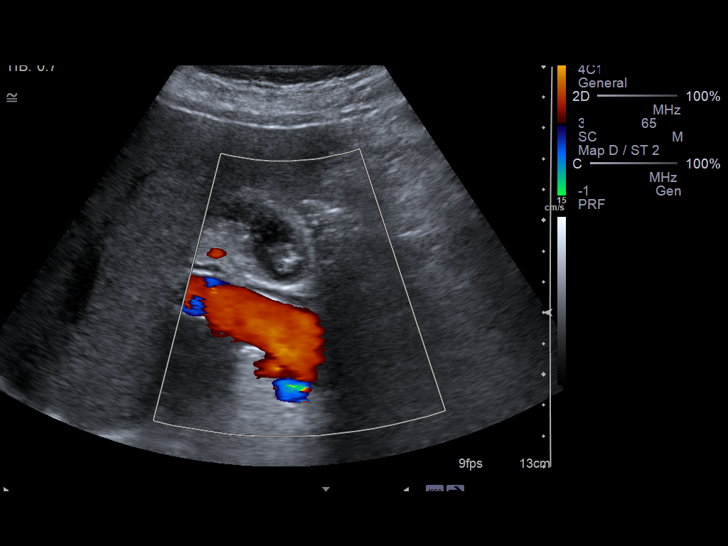

[13 of 25 positions shown; findings below may reference images not displayed]

FINDINGS: Gallbladder:  There is a 14 mm gallstone in the neck region of the
gallbladder.  There is associated gallbladder wall thickening and
edema suggesting acute cholecystitis.

Common bile duct:  Normal in caliber measuring a maximum of 2.3 mm.

Liver:  The liver is sonographically unremarkable.  There is normal
echogenicity without focal lesions or intrahepatic biliary
dilatation.

IVC:  Normal caliber.

Pancreas:  Sonographically normal.

Spleen:  Normal in size and echogenicity without focal lesions.

Right Kidney:  10.7 cm in length. Normal renal cortical thickness
and echogenicity without focal lesions or hydronephrosis.

Left Kidney:  9.9 cm in length.  Normal renal cortical thickness
and echogenicity.  Multiple lower pole renal calculi are noted as
demonstrated on the recent CT scan.  Lower pole hydronephrosis is
noted.

Abdominal aorta:  Normal caliber.
IMPRESSION: 1.  14 mm gallstone in the gallbladder neck with associated
gallbladder wall thickening and edema suggesting acute
cholecystitis.
2.  Normal caliber common bile duct.
3.  Lower pole left renal calculi and mild lower pole
hydronephrosis.

## 2012-03-14 IMAGING — RF DG CHOLANGIOGRAM OPERATIVE
1 series · 8 of 8 positions shown · non-contrast
Comparison: None.

CLINICAL DATA: Lap cholecystectomy for gallstones.

INTRAOPERATIVE CHOLANGIOGRAM
TECHNIQUE: Cholangiographic images from the C-arm fluoroscopic
device were submitted for interpretation post-operatively.  Please
see the procedural report for the amount of contrast and the
fluoroscopy time utilized.

[Series 1: run · 2 acquisitions, 8 frames shown]
[im 1/2]
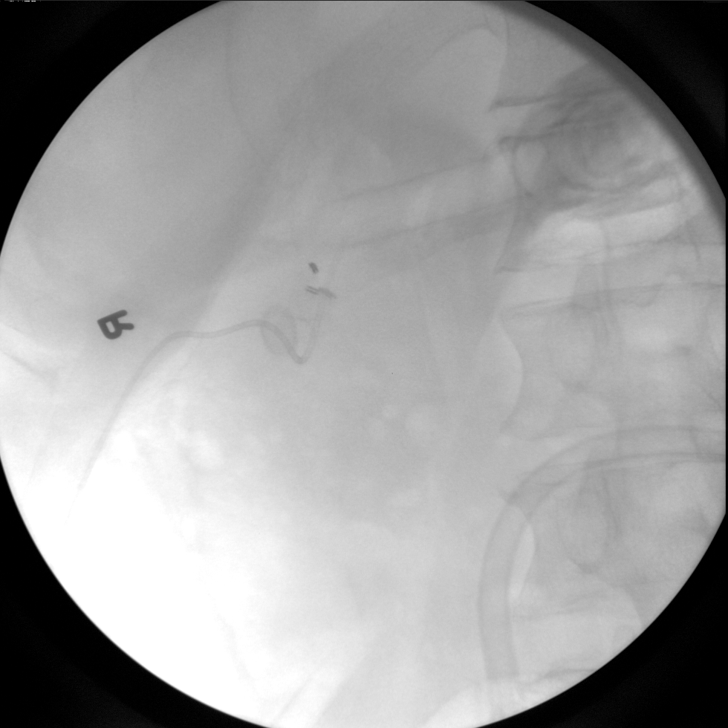
[im 1/2]
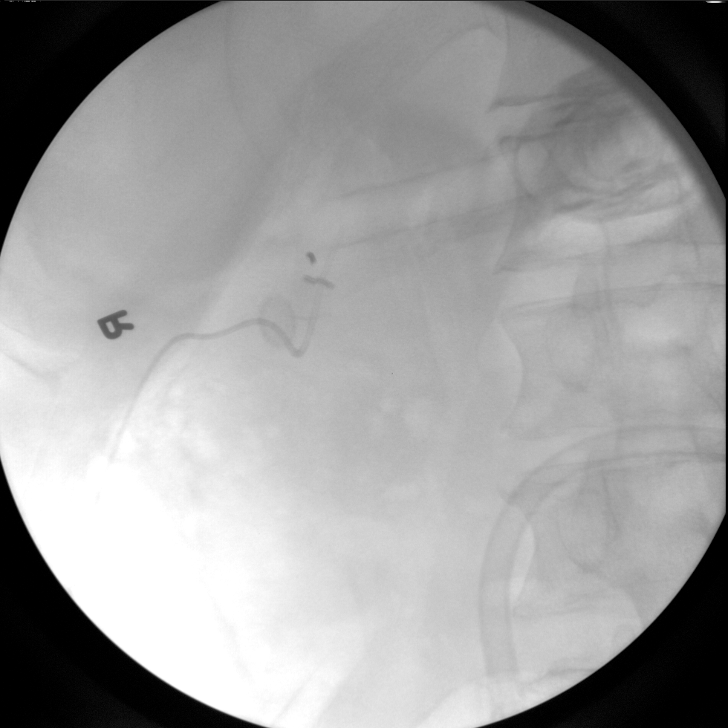
[im 1/2]
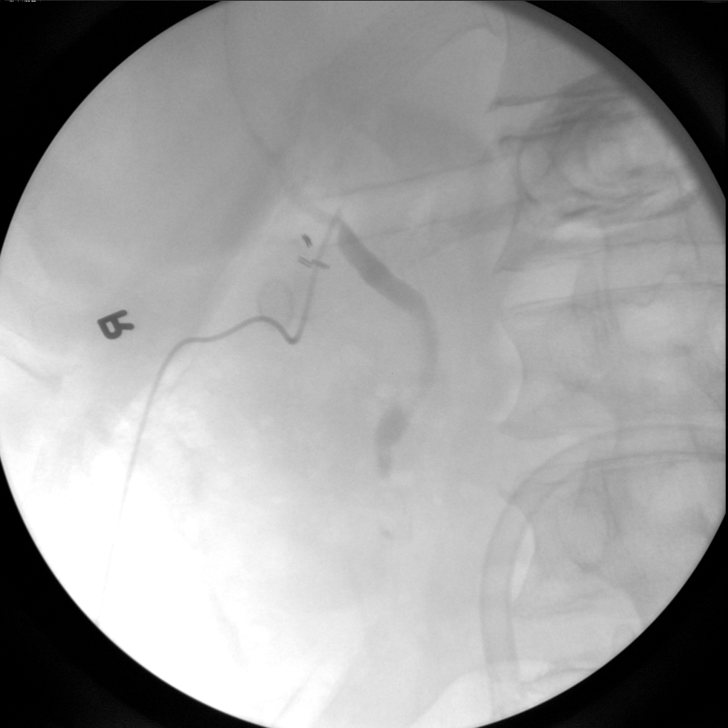
[im 1/2]
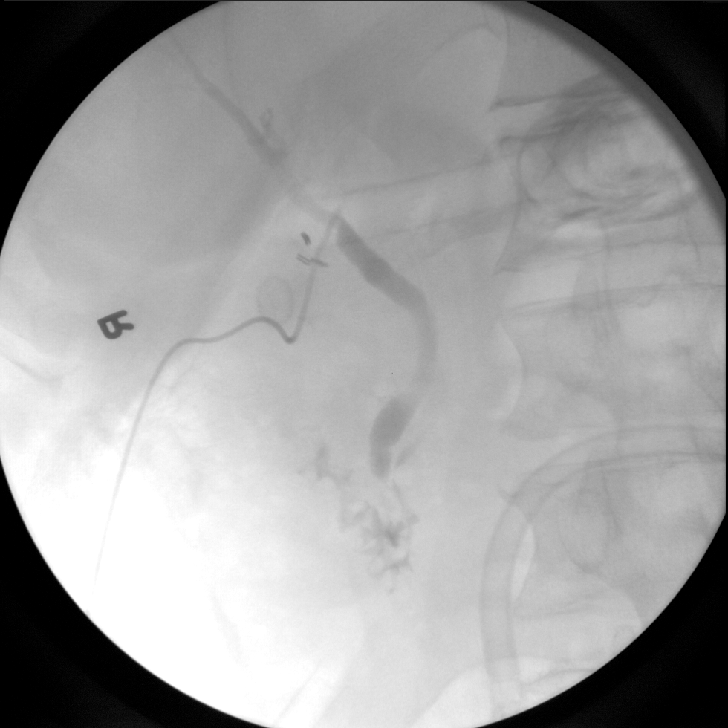
[im 2/2]
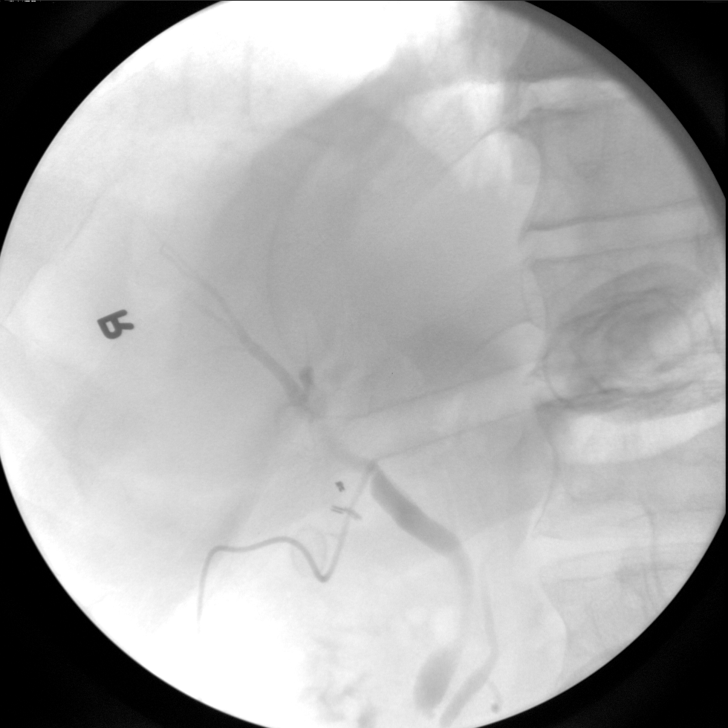
[im 2/2]
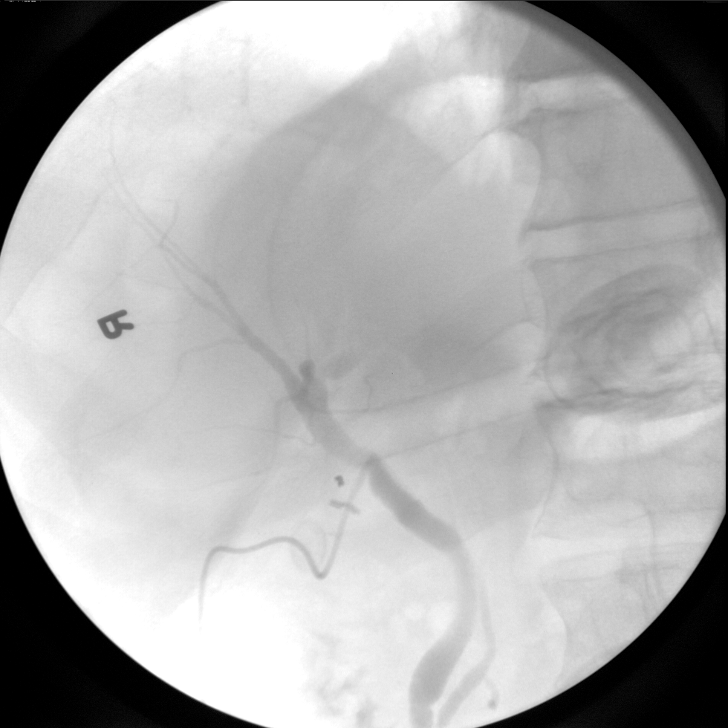
[im 2/2]
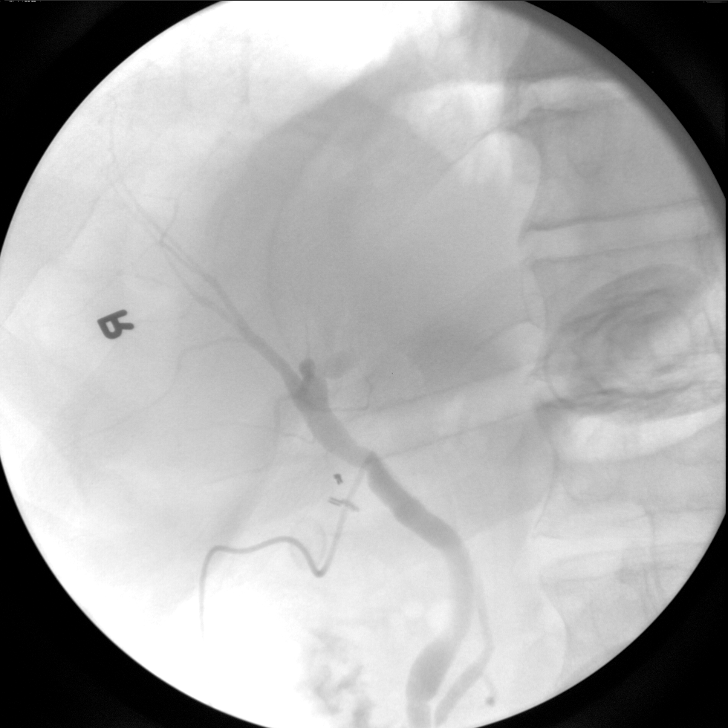
[im 2/2]
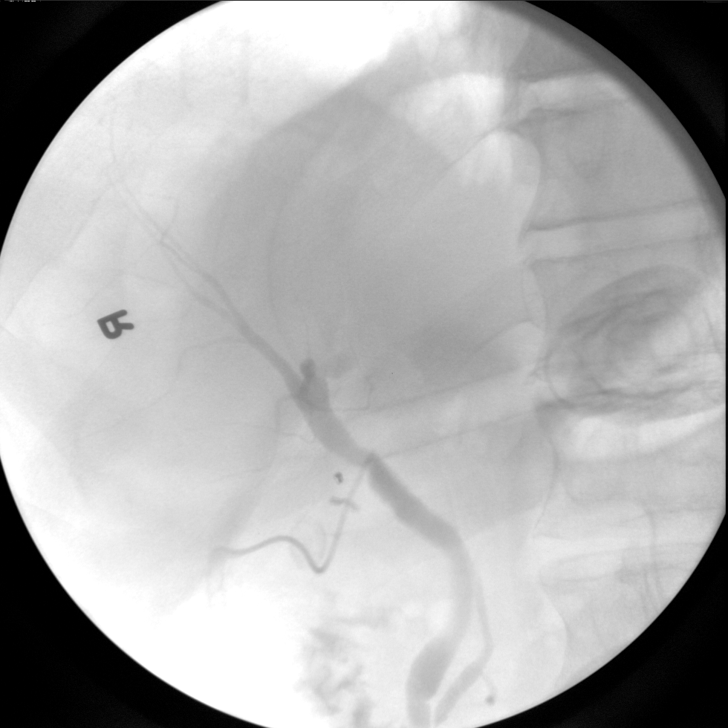

[8 of 8 positions shown; findings below may reference images not displayed]

FINDINGS: Two spot fluoro runs are submitted after the procedure
for review.  These show opacification of the biliary system through
a cystic duct cannula.  There is no filling defect within the
common duct to suggest the presence of retained stone.  Contrast
can be seen flowing through the ampulla into the duodenal lumen.
No contrast extravasation to suggest bile leak.
IMPRESSION: No evidence for a filling defect to suggest the presence of
retained common duct stone.

## 2012-04-26 IMAGING — CT CT ABD-PELV W/O CM
2 of 4 series · 17 of 46 positions shown, 19 images · non-contrast
Comparison: CT of the abdomen and pelvis performed 06/16/2010, and
abdominal ultrasound performed 06/17/2010

CLINICAL DATA: Left flank pain for 4 days.; recent hematuria.

CT ABDOMEN AND PELVIS WITHOUT CONTRAST
TECHNIQUE: Multidetector CT imaging of the abdomen and pelvis was
performed following the standard protocol without intravenous
contrast.

[Series 2: stone_wo 5.0 b40f st · axial · 0.75mm/px · z∈[-492,-72]mm · 14 of 92 slices shown, 16 images]
[im 4/92  soft-tissue]
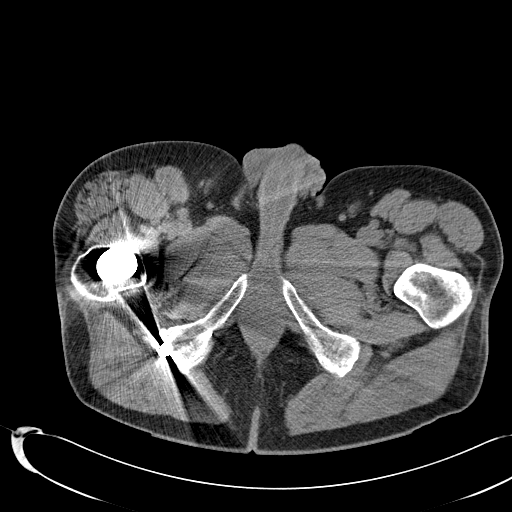
[im 4/92  bone]
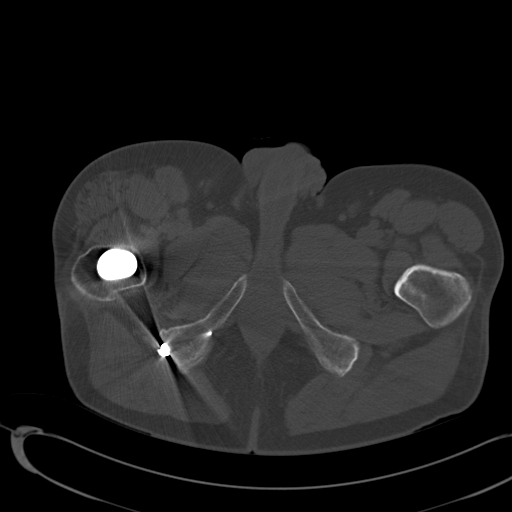
[im 12/92  soft-tissue]
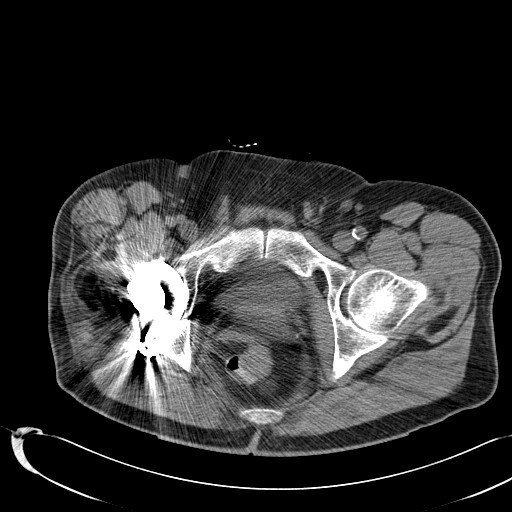
[im 16/92  soft-tissue]
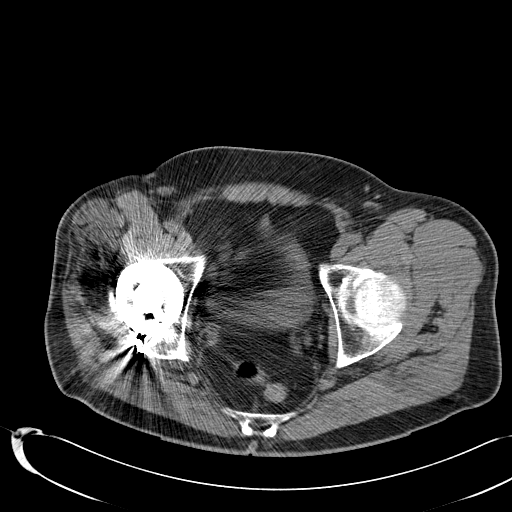
[im 24/92  soft-tissue]
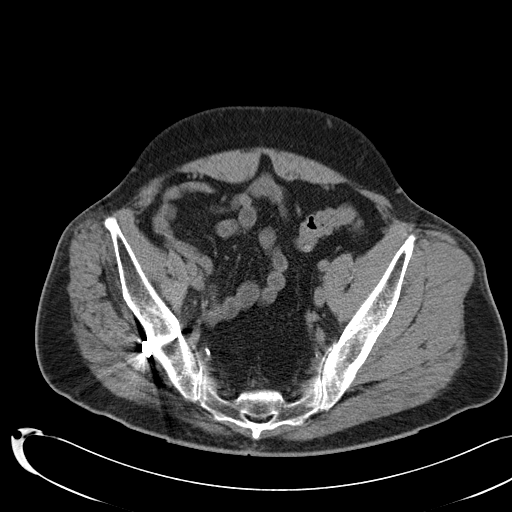
[im 32/92  soft-tissue]
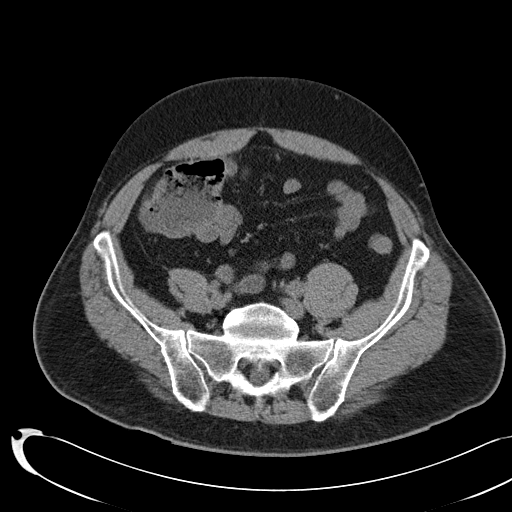
[im 36/92  soft-tissue]
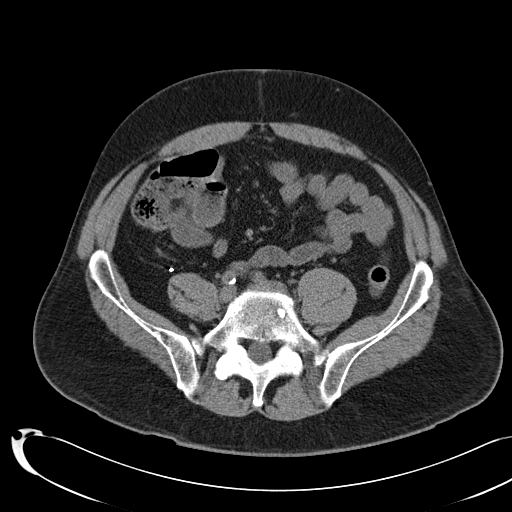
[im 44/92  soft-tissue]
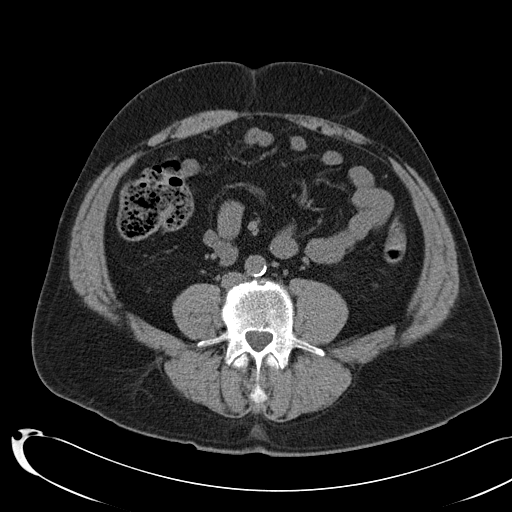
[im 48/92  soft-tissue]
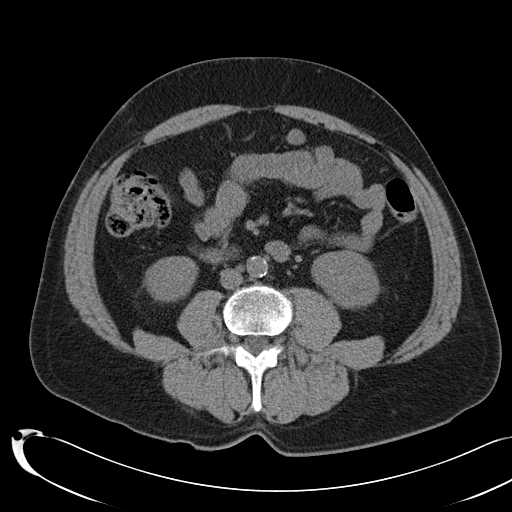
[im 56/92  soft-tissue]
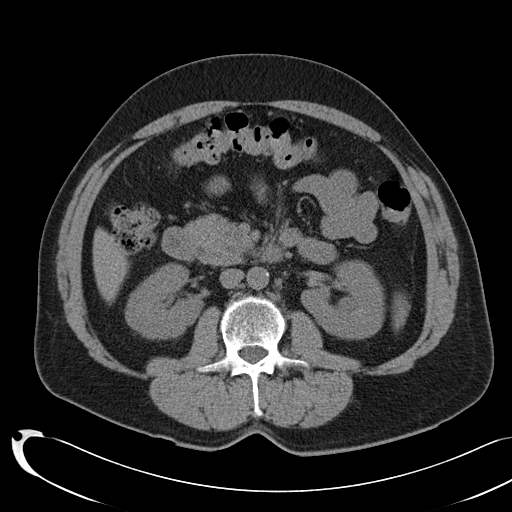
[im 56/92  bone]
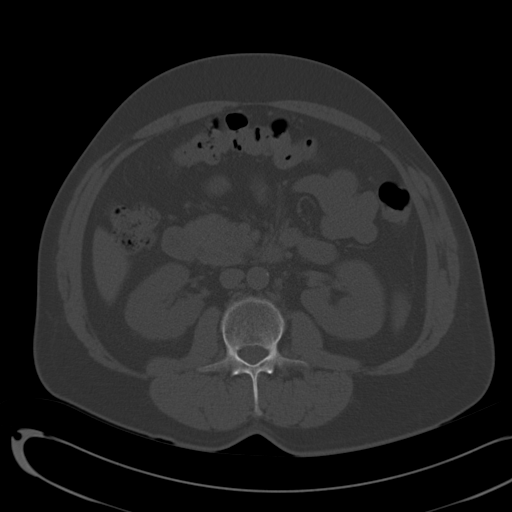
[im 60/92  soft-tissue]
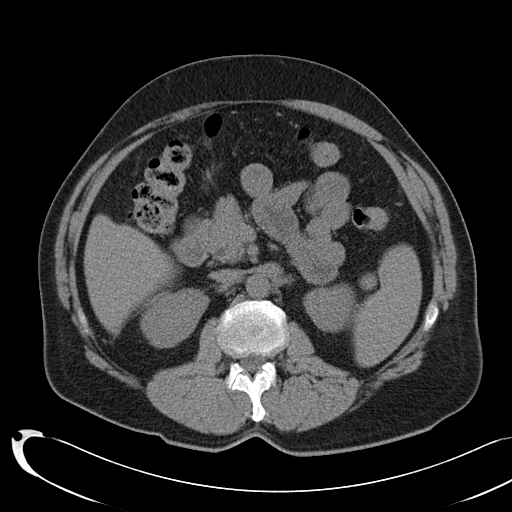
[im 68/92  soft-tissue]
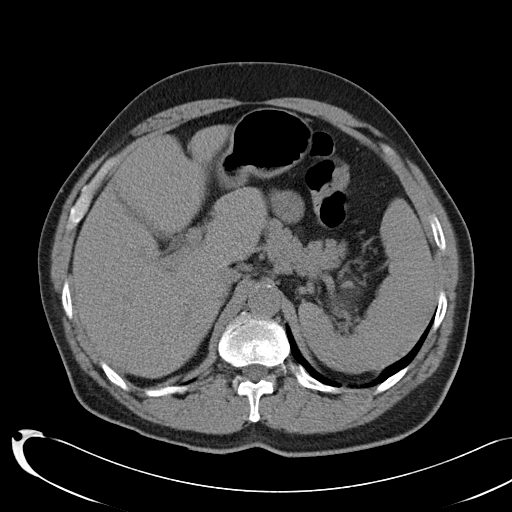
[im 76/92  soft-tissue]
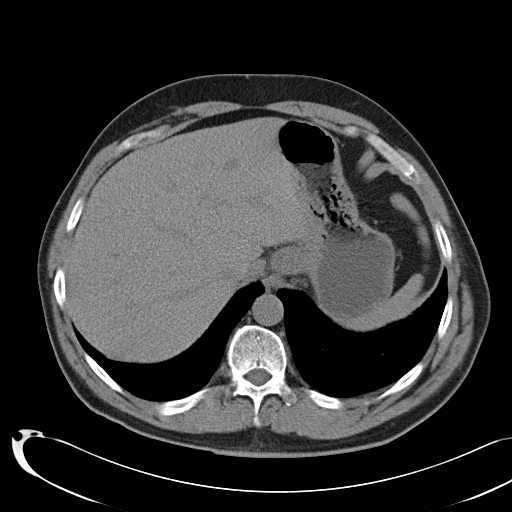
[im 80/92  soft-tissue]
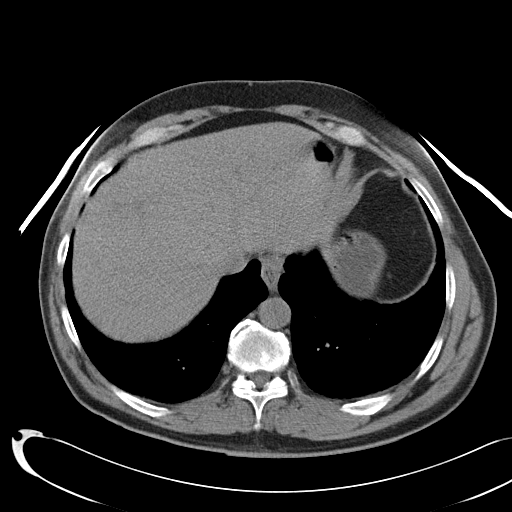
[im 88/92  soft-tissue]
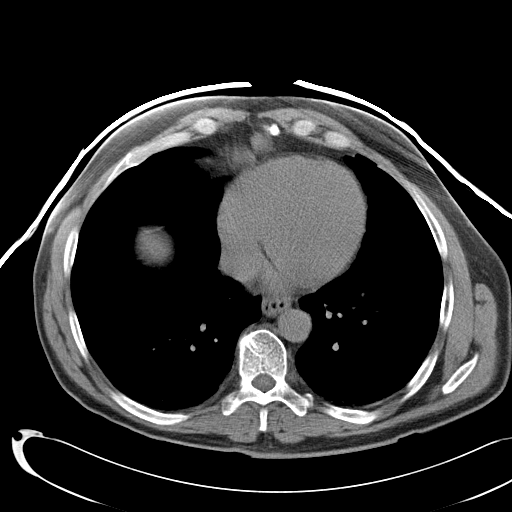

[Series 602: coronal abdomen · coronal · 0.93mm/px · 3 of 145 slices shown]
[im 49/145  soft-tissue]
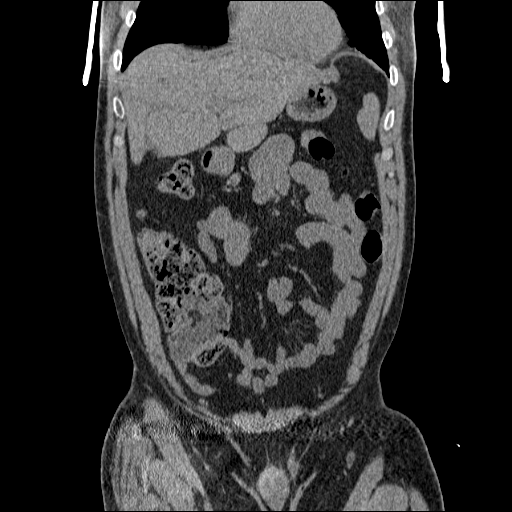
[im 65/145  soft-tissue]
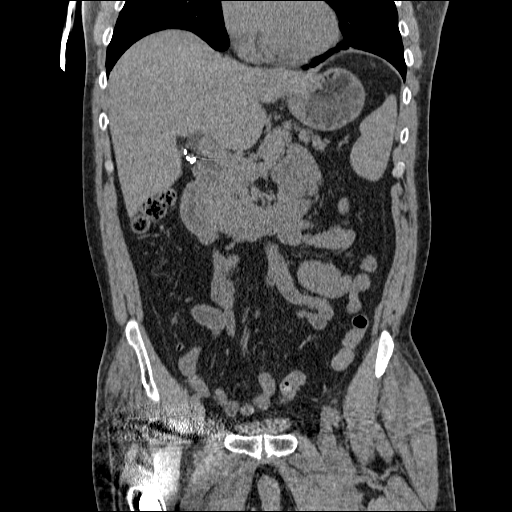
[im 81/145  soft-tissue]
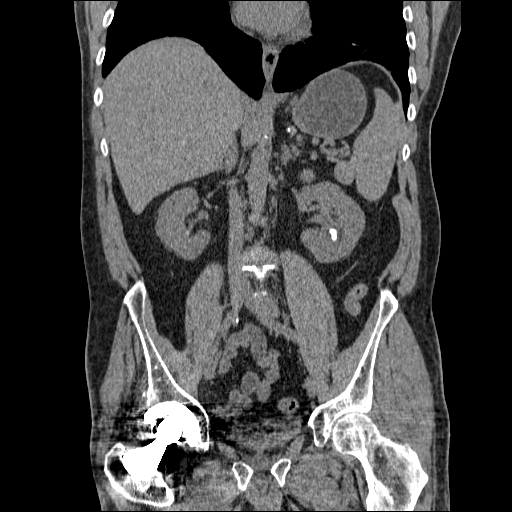

[17 of 46 positions shown; findings below may reference images not displayed]

FINDINGS: Minimal bibasilar atelectasis is noted.

The liver and spleen are unremarkable in appearance. Scattered
clips are noted at the gallbladder fossa, status post
cholecystectomy.  The pancreas and adrenal glands are unremarkable.

There is no evidence of hydronephrosis.  Multiple prominent stones
are noted within the left kidney, the largest of which measures
cm in size.  The right kidney is unremarkable in appearance.  No
significant perinephric stranding is seen.  No ureteral stones are
identified.

No free fluid is identified.  The small bowel is unremarkable in
appearance.  The stomach is within normal limits.  No acute
vascular abnormalities are seen.  Scattered calcification is noted
along the distal abdominal aorta and its branches.

The appendix is normal in size and contains air, without evidence
for appendicitis.  The colon is unremarkable in appearance.

The bladder is decompressed and not well assessed; a small urachal
remnant is incidentally noted.  A small inguinal hernia is noted on
the left side, containing only fat.  The prostate is normal in
size, and contains scattered calcification.  No inguinal
lymphadenopathy is seen.

No acute osseous abnormalities are identified.  The patient's right
hip prosthesis appears grossly intact.
IMPRESSION: 1.  No evidence of hydronephrosis; multiple nonobstructing left
renal stones seen.
2.  Scattered calcification along the abdominal aorta and its
branches.
3.  Small left inguinal hernia, containing only fat.

## 2017-10-18 ENCOUNTER — Encounter (HOSPITAL_COMMUNITY): Payer: Self-pay | Admitting: *Deleted

## 2017-10-18 ENCOUNTER — Emergency Department (HOSPITAL_COMMUNITY)
Admission: EM | Admit: 2017-10-18 | Discharge: 2017-10-19 | Disposition: A | Discharge status: Home / Self Care | Payer: Medicare Other | Attending: Emergency Medicine | Admitting: Emergency Medicine

## 2017-10-18 ENCOUNTER — Other Ambulatory Visit: Payer: Self-pay

## 2017-10-18 DIAGNOSIS — F329 Major depressive disorder, single episode, unspecified: Secondary | ICD-10-CM | POA: Diagnosis not present

## 2017-10-18 DIAGNOSIS — R45851 Suicidal ideations: Secondary | ICD-10-CM | POA: Insufficient documentation

## 2017-10-18 DIAGNOSIS — N3001 Acute cystitis with hematuria: Secondary | ICD-10-CM | POA: Diagnosis not present

## 2017-10-18 DIAGNOSIS — J449 Chronic obstructive pulmonary disease, unspecified: Secondary | ICD-10-CM | POA: Diagnosis not present

## 2017-10-18 DIAGNOSIS — F17210 Nicotine dependence, cigarettes, uncomplicated: Secondary | ICD-10-CM | POA: Insufficient documentation

## 2017-10-18 DIAGNOSIS — F29 Unspecified psychosis not due to a substance or known physiological condition: Secondary | ICD-10-CM | POA: Diagnosis present

## 2017-10-18 DIAGNOSIS — F32A Depression, unspecified: Secondary | ICD-10-CM | POA: Diagnosis present

## 2017-10-18 DIAGNOSIS — Z008 Encounter for other general examination: Secondary | ICD-10-CM

## 2017-10-18 HISTORY — DX: Unspecified cirrhosis of liver: K74.60

## 2017-10-18 HISTORY — DX: Chronic obstructive pulmonary disease, unspecified: J44.9

## 2017-10-18 LAB — ETHANOL

## 2017-10-18 LAB — COMPREHENSIVE METABOLIC PANEL
ALT: 60 U/L — AB (ref 0–44)
AST: 56 U/L — AB (ref 15–41)
Albumin: 4 g/dL (ref 3.5–5.0)
Alkaline Phosphatase: 89 U/L (ref 38–126)
Anion gap: 8 (ref 5–15)
BILIRUBIN TOTAL: 1.5 mg/dL — AB (ref 0.3–1.2)
BUN: 9 mg/dL (ref 8–23)
CO2: 28 mmol/L (ref 22–32)
CREATININE: 1.09 mg/dL (ref 0.61–1.24)
Calcium: 9.2 mg/dL (ref 8.9–10.3)
Chloride: 102 mmol/L (ref 98–111)
GFR calc Af Amer: >60 mL/min (ref >60)
Glucose, Bld: 80 mg/dL (ref 70–99)
POTASSIUM: 3.9 mmol/L (ref 3.5–5.1)
Sodium: 138 mmol/L (ref 135–145)
TOTAL PROTEIN: 7.8 g/dL (ref 6.5–8.1)

## 2017-10-18 LAB — URINALYSIS, ROUTINE W REFLEX MICROSCOPIC
BILIRUBIN URINE: NEGATIVE
Glucose, UA: NEGATIVE mg/dL
HGB URINE DIPSTICK: NEGATIVE
Ketones, ur: 5 mg/dL — AB
Nitrite: NEGATIVE
PROTEIN: NEGATIVE mg/dL
Specific Gravity, Urine: 1.03 (ref 1.005–1.030)
pH: 5 (ref 5.0–8.0)

## 2017-10-18 LAB — CBC
HCT: 49.8 % (ref 39.0–52.0)
Hemoglobin: 17.6 g/dL — ABNORMAL HIGH (ref 13.0–17.0)
MCH: 32.8 pg (ref 26.0–34.0)
MCHC: 35.3 g/dL (ref 30.0–36.0)
MCV: 92.9 fL (ref 78.0–100.0)
PLATELETS: 179 10*3/uL (ref 150–400)
RBC: 5.36 MIL/uL (ref 4.22–5.81)
RDW: 13.5 % (ref 11.5–15.5)
WBC: 10.9 10*3/uL — ABNORMAL HIGH (ref 4.0–10.5)

## 2017-10-18 LAB — RAPID URINE DRUG SCREEN, HOSP PERFORMED
Amphetamines: NOT DETECTED
Benzodiazepines: NOT DETECTED
Cocaine: NOT DETECTED
Opiates: NOT DETECTED
Tetrahydrocannabinol: POSITIVE — AB

## 2017-10-18 LAB — SALICYLATE LEVEL: Salicylate Lvl: <7 mg/dL (ref 2.8–30.0)

## 2017-10-18 LAB — ACETAMINOPHEN LEVEL: Acetaminophen (Tylenol), Serum: <10 ug/mL — ABNORMAL LOW (ref 10–30)

## 2017-10-18 MED ORDER — ALBUTEROL SULFATE HFA 108 (90 BASE) MCG/ACT IN AERS
2.0000 | INHALATION_SPRAY | Freq: Four times a day (QID) | RESPIRATORY_TRACT | Status: DC | PRN
  Administered 2017-10-19: 2 via RESPIRATORY_TRACT
  Filled 2017-10-18: qty 6.7

## 2017-10-18 MED ORDER — CEPHALEXIN 500 MG PO CAPS
500.0000 mg | ORAL_CAPSULE | Freq: Four times a day (QID) | ORAL | Status: DC
  Administered 2017-10-18 – 2017-10-19 (×5): 500 mg via ORAL
  Filled 2017-10-18 (×5): qty 1

## 2017-10-18 MED ORDER — NICOTINE 21 MG/24HR TD PT24
21.0000 mg | MEDICATED_PATCH | Freq: Every day | TRANSDERMAL | Status: DC
  Administered 2017-10-18: 21 mg via TRANSDERMAL
  Filled 2017-10-18: qty 1

## 2017-10-18 NOTE — ED Notes (Signed)
Pt ambulatory w/o difficulty to room 36 

## 2017-10-18 NOTE — ED Notes (Signed)
Bed: [facility]30 Expected date:  Expected time:  Means of arrival:  Comments: 

## 2017-10-18 NOTE — ED Notes (Signed)
[id number] Expected date:  Expected time:  Means of arrival:  Comments: Room 30

## 2017-10-18 NOTE — ED Notes (Signed)
Pt A&O x 3, no distress noted, presents with SI, plan to walk in front of car.  Pt off meds also.  Admits to history of Depression.  Monitoring for safety, Q 15 min checks in effect.

## 2017-10-18 NOTE — ED Provider Notes (Signed)
[facility]   CSN: [id number] Arrival date & time: 10/18/17  1402     History   Chief Complaint Chief Complaint  Patient presents with  . Psychiatric Evaluation  . Suicidal    HPI [name] is a 67 y.o. male past medical history of COPD, cirrhosis and reported history of bipolar disorder who presents emergency department today for suicidal ideation and medical clearance.  Patient reports that over the last 1 week he has been having increasing thoughts of harming himself.  He states that he thinks about walking in front of a train or a car.  The patient denies any homicidal ideation.  He reports he feels like "everyone is out to get me".  He denies any visual or auditory hallucinations.  Patient reports he used to be on bipolar medications but not been on any in some time.  He denies any alcohol or illicit drug use.  He notes his only daily medication is Keflex that he was recently prescribed for urinary tract infection.  Patient denies any associated fever, chills, flank pain, abdominal pain, urinary frequency, urinary urgency, dysuria, hematuria, painful bowel movements, or nausea/vomiting.Marland Kitchen  He states his symptoms have resolved since starting Keflex.  Patient reports no physical complaints at this time.  He denies any fever, chills, headache, neck pain, chest pain, shortness of breath, cough, lower leg swelling or arthralgias.  He notes he is currently homeless and this has been attributed to the patient's symptoms of depression.  HPI  Past Medical History:  Diagnosis Date  . Anxiety   . Asthma   . Cirrhosis (HCC)   . COPD (chronic obstructive pulmonary disease) (HCC)   . Depression     There are no active problems to display for this patient.   Past Surgical History:  Procedure Laterality Date  . HIP SURGERY     replacement -right        Home Medications    Prior to Admission medications   Not on File    Family  History No family history on file.  Social History Social History   Tobacco Use  . Smoking status: Current Every Day Smoker    Types: Cigarettes  . Smokeless tobacco: Never Used  Substance Use Topics  . Alcohol use: No  . Drug use: Yes    Types: Marijuana, Oxycodone     Allergies   Patient has no known allergies.   Review of Systems Review of Systems  All other systems reviewed and are negative.    Physical Exam Updated Vital Signs BP 123/72 (BP Location: Left Arm)   Pulse 66   Temp 97.8 F (36.6 C) (Oral)   Resp 16   Ht 5\' 5"  (1.651 m)   Wt 68 kg (150 lb)   SpO2 97%   BMI 24.96 kg/m   Physical Exam  Constitutional: He appears well-developed and well-nourished.  HENT:  Head: Normocephalic and atraumatic.  Right Ear: External ear normal.  Left Ear: External ear normal.  Nose: Nose normal.  Mouth/Throat: Uvula is midline, oropharynx is clear and moist and mucous membranes are normal. No tonsillar exudate.  Eyes: Pupils are equal, round, and reactive to light. Right eye exhibits no discharge. Left eye exhibits no discharge. No scleral icterus.  Neck: Trachea normal. Neck supple. No spinous process tenderness present. No neck rigidity. Normal range of motion present.  Cardiovascular: Normal rate, regular rhythm and intact distal pulses.  No murmur heard. Pulses:  Radial pulses are 2+ on the right side, and 2+ on the left side.       Dorsalis pedis pulses are 2+ on the right side, and 2+ on the left side.       Posterior tibial pulses are 2+ on the right side, and 2+ on the left side.  No lower extremity swelling or edema. Calves symmetric in size bilaterally.  Pulmonary/Chest: Effort normal and breath sounds normal. He exhibits no tenderness.  Abdominal: Soft. Bowel sounds are normal. There is no tenderness. There is no rigidity, no rebound, no guarding and no CVA tenderness.  Musculoskeletal: He exhibits no edema.  Lymphadenopathy:    He has no cervical  adenopathy.  Neurological: He is alert.  Skin: Skin is warm and dry. No rash noted. He is not diaphoretic.  Psychiatric: He has a normal mood and affect.  Pleasant and cooperative  Nursing note and vitals reviewed.    ED Treatments / Results  Labs (all labs ordered are listed, but only abnormal results are displayed) Labs Reviewed  COMPREHENSIVE METABOLIC PANEL - Abnormal; Notable for the following components:      Result Value   AST 56 (*)    ALT 60 (*)    Total Bilirubin 1.5 (*)    All other components within normal limits  ACETAMINOPHEN LEVEL - Abnormal; Notable for the following components:   Acetaminophen (Tylenol), Serum <10 (*)    All other components within normal limits  CBC - Abnormal; Notable for the following components:   WBC 10.9 (*)    Hemoglobin 17.6 (*)    All other components within normal limits  RAPID URINE DRUG SCREEN, HOSP PERFORMED - Abnormal; Notable for the following components:   Tetrahydrocannabinol POSITIVE (*)    Barbiturates   (*)    Value: Result not available. Reagent lot number recalled by manufacturer.   All other components within normal limits  URINALYSIS, ROUTINE W REFLEX MICROSCOPIC - Abnormal; Notable for the following components:   Color, Urine AMBER (*)    APPearance TURBID (*)    Ketones, ur 5 (*)    Leukocytes, UA SMALL (*)    WBC, UA >50 (*)    Bacteria, UA RARE (*)    All other components within normal limits  URINE CULTURE  ETHANOL  SALICYLATE LEVEL    EKG None  Radiology No results found.  Procedures Procedures (including critical care time)  Medications Ordered in ED Medications  cephALEXin (KEFLEX) capsule 500 mg (has no administration in time range)  nicotine (NICODERM CQ - dosed in mg/24 hours) patch 21 mg (has no administration in time range)  albuterol (PROVENTIL HFA;VENTOLIN HFA) 108 (90 Base) MCG/ACT inhaler 2 puff (has no administration in time range)     Initial Impression / Assessment and Plan / ED  Course  I have reviewed the triage vital signs and the nursing notes.  Pertinent labs & imaging results that were available during my care of the patient were reviewed by me and considered in my medical decision making (see chart for details).     67 year old male with a history of COPD, cirrhosis and reported history of bipolar disorder presenting with suicidal ideation.  Patient does have a plan.  He reports he is currently being treated for UTI with Keflex.  Patient denies any current symptoms.  He denies any painful bowel movements make me concern for prostatitis.  Patient is afebrile in the department, and denies any flank pain, nausea or vomiting.  He has  no CVA tenderness.  No concern for pyelonephritis.  UA does show evidence of UTI.  Urine culture was sent.  His Keflex was restarted.  Patient without any other physical complaints. Labs reviewed and reassuring. Patient will require TTS evaluation. Home medications reordered. Patient is medically cleared and moved to psych hold.   TTS recommends inpatient treatment.   Final Clinical Impressions(s) / ED Diagnoses   Final diagnoses:  Encounter for medical clearance for patient hold  Acute cystitis with hematuria    ED Discharge Orders    None       Princella Pellegrini 10/18/17 1934    Linwood Dibbles, MD 10/20/17 1030

## 2017-10-18 NOTE — BH Assessment (Addendum)
Assessment Note  [name] is a 67 y.o. male, in [place] w/ complaints of SI. Pt reports that for the past week, he's been having SI w/ thoughts of walking in front of a train or car. Pt was unable to identify any specific trigger to his SI. Pt indicates that he was on psych mediations until @ 6 or 7 months ago, in [place]. Pt denies HI. Pt reports AH of voices saying disparaging things to him like, "your life ain't worth it". Pt reports some command voices, as well, but indicates that he just ignores them. Pt reports last hearing voices "a couple of days ago". Pt also reports VH of "black dogs". Pt did not appear to be responding to internal stimuli or operating in delusional thought content while participating in the assessment.   Pt reports being homeless, living in shelters or on the streets for @ 30 years. Pt gets a disability check and is unable to clearly explain why he has been homeless for so long when he has a steady stream of income. Pt is originally from [place], but has been back and forth from there to [facility] for years.  Staffed case with Nanine Means, DNP, and she recommends IP geropsych treatment.    Diagnosis: Bipolar I, by hx  Past Medical History:  Past Medical History:  Diagnosis Date  . Anxiety   . Asthma   . Cirrhosis (HCC)   . COPD (chronic obstructive pulmonary disease) (HCC)   . Depression     Past Surgical History:  Procedure Laterality Date  . HIP SURGERY     replacement -right    Family History: No family history on file.  Social History:  reports that he has been smoking cigarettes.  He has never used smokeless tobacco. He reports that he has current or past drug history. Drugs: Marijuana and Oxycodone. He reports that he does not drink alcohol.  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  General Assessment Data [facility] of Assessment: [facility] TTS Assessment: In system Is this a Tele or Face-to-Face Assessment?: Face-to-Face Is this an Initial Assessment or a Re-assessment for this encounter?: Initial Assessment Marital status: Single Living Arrangements: Other (Comment)(homeless) Can pt return to current living arrangement?: Yes Admission Status: Voluntary Is patient capable of signing voluntary admission?: Yes Referral Source: Self/Family/Friend     Crisis Care Plan Living Arrangements: Other (Comment)(homeless) Name of Psychiatrist: none Name of Therapist: none  Education Status Is patient currently in school?: No Is the patient employed, unemployed or receiving disability?: Unemployed, Receiving disability income  Risk to self with the past 6 months Suicidal Ideation: Yes-Currently Present Has patient been a risk to self within the past 6 months prior to admission? : No Suicidal Intent: Yes-Currently Present Has patient had any suicidal intent within the past 6 months prior to admission? : No Is patient at risk for suicide?: Yes Suicidal Plan?: Yes-Currently Present Has patient had any suicidal plan within the past 6 months prior to admission? : No Specify Current Suicidal Plan: walk in front of a train or car Access to Means: Yes  Specify Access to Suicidal Means: legs What has been your use of drugs/alcohol within the last 12 months?: "I quit a drug habit 4 years ago" Previous Attempts/Gestures: Yes How many times?: ("a couple of times") Triggers for Past Attempts: Unknown, Unpredictable Intentional Self Injurious Behavior: None Family Suicide History: Unknown Persecutory voices/beliefs?: Yes Depression:  Yes Depression Symptoms: Feeling worthless/self pity, Loss of interest in usual pleasures, Isolating, Insomnia Substance abuse history and/or treatment for substance abuse?: Yes Suicide prevention information given to non-admitted patients: Not applicable  Risk to Others within the past 6 months Homicidal Ideation: No Does patient have any lifetime risk of violence toward others beyond the six months prior to admission? : No Thoughts of Harm to Others: No Current Homicidal Intent: No Current Homicidal Plan: No Access to Homicidal Means: No History of harm to others?: No Assessment of Violence: None Noted Does patient have access to weapons?: No Criminal Charges Pending?: No Does patient have a court date: No Is patient on probation?: No  Psychosis Hallucinations: Auditory, Visual, With command Delusions: None noted  Mental Status Report Appearance/Hygiene: Unremarkable Eye Contact: Good Motor Activity: Unremarkable Speech: Logical/coherent Level of Consciousness: Alert Mood: Pleasant, Euthymic Affect: Appropriate to circumstance Anxiety Level: Minimal Thought Processes: Coherent, Relevant Judgement: Partial Orientation: Person, Place, Time, Situation Obsessive Compulsive Thoughts/Behaviors: None  Cognitive Functioning Concentration: Normal Memory: Remote Impaired, Recent Intact Is patient IDD: No Is patient DD?: No Insight: Fair Impulse Control: Unable to Assess Appetite: Poor Have you had any weight changes? : Loss Amount of the weight change? (lbs): 10 lbs Sleep: Decreased Total Hours of Sleep: 3 Vegetative Symptoms: None  ADLScreening [facility] Assessment Services) Patient's cognitive ability adequate to safely complete daily activities?: Yes Patient able to express need for assistance with ADLs?: Yes Independently performs ADLs?: Yes (appropriate for developmental age)  Prior Inpatient Therapy Prior Inpatient Therapy: Yes Prior Therapy Dates: @ 5 times w/ last  time in 2012 Prior Therapy Facilty/Provider(s): hospitals in [place] and [place] [facility] Reason for Treatment: depression  Prior Outpatient Therapy Prior Outpatient Therapy: Yes Prior Therapy Dates: @ 6-7 months ago Prior Therapy Facilty/Provider(s): homeless shelter in [place] that provided med mgmt Reason for Treatment: depression Does patient have an ACCT team?: No Does patient have Intensive In-House Services?  : No Does patient have [place] services? : No Does patient have P4CC services?: No  ADL Screening (condition at time of admission) Patient's cognitive ability adequate to safely complete daily activities?: Yes Is the patient deaf or have difficulty hearing?: No Does the patient have difficulty seeing, even when wearing glasses/contacts?: No Does the patient have difficulty concentrating, remembering, or making decisions?: No Patient able to express need for assistance with ADLs?: Yes Does the patient have difficulty dressing or bathing?: No Independently performs ADLs?: Yes (appropriate for developmental age) Does the patient have difficulty walking or climbing stairs?: No Weakness of Legs: None Weakness of Arms/Hands: None  Home Assistive Devices/Equipment Home Assistive Devices/Equipment: None    Abuse/Neglect Assessment (Assessment to be complete while patient is alone) Abuse/Neglect Assessment Can Be Completed: Yes Physical Abuse: Denies Verbal Abuse: Denies Sexual Abuse: Denies Exploitation of patient/patient's resources: Denies Self-Neglect: Denies     Merchant navy officer (For Healthcare) Does Patient Have a Medical Advance Directive?: No Would patient like information on creating a medical advance directive?: No - Patient declined Nutrition Screen- [facility] Patient's home diet: Regular Has the patient recently lost weight without trying?: Yes, 2-13 lbs. Has the patient been eating poorly because of a decreased appetite?:  Yes Malnutrition  Screening Tool Score: 2        Disposition:  Disposition Initial Assessment Completed for this Encounter: Yes  On Site Evaluation by:   Reviewed with Physician:    Laddie AquasSamantha M Sabria Florido 10/18/2017 5:59 PM

## 2017-10-19 ENCOUNTER — Ambulatory Visit (HOSPITAL_COMMUNITY): Payer: Self-pay

## 2017-10-19 DIAGNOSIS — F32A Depression, unspecified: Secondary | ICD-10-CM | POA: Diagnosis present

## 2017-10-19 DIAGNOSIS — F329 Major depressive disorder, single episode, unspecified: Secondary | ICD-10-CM | POA: Diagnosis not present

## 2017-10-19 LAB — URINE CULTURE: Culture: NO GROWTH

## 2017-10-19 NOTE — Patient Outreach (Signed)
[facility] met with the patient and provided substance use recovery support. Patient states that he has 4 years of continuous sobriety. Patient states that he was interested in getting in contact with a homeless shelter. CPSS talked to the patient about recovery housing, but the patient was more interested in getting connected with a shelter. Patient asked CPSS to call the [facility] and they currently do not have any bed available till Monday. CPSS provided the patient with other shelter information, [facility]PSS contact information, and bus passes. CPSS strongly encouraged the patient to contact [facility]PSS for further substance use recovery support or for help with resources.

## 2017-10-19 NOTE — Discharge Instructions (Signed)
To help you maintain a sober lifestyle, a substance abuse treatment program may be beneficial to you.  Contact one of the following facilities at your earliest opportunity to ask about enrolling:  RESIDENTIAL PROGRAMS:       [facility]      [address]      [place], [place] [address]      [phone]       [facility]      [address]      [place], [place] [address]      [phone]       [facility]      [address]      [place], [place] [address]      [phone]  OUTPATIENT PROGRAMS:       [facility]      [address]      [place], [place] [address]      [phone]      New patients are seen at the walk-in clinic every Tuesday from 9:00 am - 12:00 pm  HALFWAY HOUSES:       [facility]      [address]      [place], [place] [address]      [phone]  Also, refer to handout for area [facility] provided by Emergency Department staff.

## 2017-10-19 NOTE — BH Assessment (Addendum)
[facility] Assessment Progress Note  Per .Lorenso QuarryHiren Umrania, MD, this pt does not require psychiatric hospitalization at this time.  [facility] substance abuse rehabilitation resources.  These have been included in pt's discharge instructions, along with printed information about [facility].  Pt would also benefit from seeing Peer Support Specialists; they will be asked to speak to pt.  Pt's nurse, Morrie Sheldonshley, has been notified.  Doylene Canninghomas Kellsie Grindle, MA Triage Specialist [phone]

## 2017-10-19 NOTE — ED Notes (Signed)
Pt d/c home per MD order. Discharge summary reviewed with pt. Pt uninterested. Denies SI/HI/AVH. Personal property returned. Pt signed e-signature. Ambulatory off unit with [facility].

## 2017-10-19 NOTE — Consult Note (Signed)
[facility] Discharge  10/19/2017 11:14 AM [name]  MRN:  [id number] Principal Problem: Depression Discharge Diagnoses:  Patient Active Problem List   Diagnosis Date Noted  . Depression [F32.9] 10/19/2017    Subjective: Pt was seen and chart reviewed with treatment team and Dr Jerold CoombeUmrania. Pt denies suicidal/homicidal ideation, denies auditory/visual hallucinations and does not appear to be responding to internal stimuli. Pt stated he was feeling bad yesterday and had thoughts of hurting himself but today is feeling better now. Pt stated he receives 889.00 in a disability check and has been living in a hotel but basically is homeless and wants resources for housing. Pt is calm and cooperative in the ED and stays in his room most of the time. Pt has a history of COPD, cirrhosis and a reported history of Bipolar but has not taken any medications for years. [facility] on [facility]09-2017 and is currently being treated for a UTI with Keflex. His only other medication is an inhaler for his COPD. Labs were reviewed with some mildly elevated AST/ALT readings which are consistent with cirrhosis of the liver. No other testing ordered.Pt's UDS positive for THC, BAL negative.  Pt is stable and psychiatrically clear for discharge and [facility] and Pt will be seen by peer support prior to discharge. Pt will also receive housing resources from [facility].   Total Time spent with patient: 45 minutes  Past Psychiatric History: As above  Past Medical History:  Past Medical History:  Diagnosis Date  . Anxiety   . Asthma   . Cirrhosis (HCC)   . COPD (chronic obstructive pulmonary disease) (HCC)   . Depression    Past Surgical History:  Procedure Laterality Date  . HIP SURGERY     replacement -right   Family History: No family history on file. Family Psychiatric  History: Unknown Social History:  Social History   Substance and Sexual Activity   Alcohol Use No    Social History   Substance and Sexual Activity  Drug Use Yes  . Types: Marijuana, Oxycodone   Social History   Socioeconomic History  . Marital status: Single    Spouse name: Not on file  . Number of children: Not on file  . Years of education: Not on file  . Highest education level: Not on file  Occupational History  . Not on file  Social Needs  . Financial resource strain: Not on file  . Food insecurity:    Worry: Not on file    Inability: Not on file  . Transportation needs:    Medical: Not on file    Non-medical: Not on file  Tobacco Use  . Smoking status: Current Every Day Smoker    Types: Cigarettes  . Smokeless tobacco: Never Used  Substance and Sexual Activity  . Alcohol use: No  . Drug use: Yes    Types: Marijuana, Oxycodone  . Sexual activity: Not on file  Lifestyle  . Physical activity:    Days per week: Not on file    Minutes per session: Not on file  . Stress: Not on file  Relationships  . Social connections:    Talks on phone: Not on file    Gets together: Not on file    Attends religious service: Not on file    Active member of club or organization: Not on file    Attends meetings of clubs or organizations: Not on file  Relationship status: Not on file  Other Topics Concern  . Not on file  Social History Narrative  . Not on file    Has this patient used any form of tobacco in the last 30 days? (Cigarettes, Smokeless Tobacco, Cigars, and/or Pipes) Prescription not provided because: Pt does not smoke  Current Medications: Current Facility-Administered Medications  Medication Dose Route Frequency Provider Last Rate Last Dose  . albuterol (PROVENTIL HFA;VENTOLIN HFA) 108 (90 Base) MCG/ACT inhaler 2 puff  2 puff Inhalation Q6H PRN Jacinto Halim, PA-C   2 puff at 10/19/17 0934  . cephALEXin (KEFLEX) capsule 500 mg  500 mg Oral Q6H Maczis, Elmer Sow, PA-C   500 mg at 10/19/17 4098  . nicotine (NICODERM CQ - dosed in mg/24  hours) patch 21 mg  21 mg Transdermal Daily Jacinto Halim, PA-C   21 mg at 10/18/17 1845   Current Outpatient Medications  Medication Sig Dispense Refill  . albuterol (PROVENTIL HFA;VENTOLIN HFA) 108 (90 Base) MCG/ACT inhaler Inhale 2 puffs into the lungs every 6 (six) hours as needed for wheezing or shortness of breath.    . cephALEXin (KEFLEX) 500 MG capsule TAKE ONE CAPSULE 4 TIMES A DAY FOR 10 DAYS  0  . ibuprofen (ADVIL,MOTRIN) 800 MG tablet Take 800 mg by mouth 3 (three) times daily.  0  . potassium chloride (K-DUR) 10 MEQ tablet Take 10 mEq by mouth 2 (two) times daily.  0     Musculoskeletal: Strength & Muscle Tone: within normal limits Gait & Station: normal Patient leans: N/A  Psychiatric Specialty Exam: Physical Exam  Constitutional: He is oriented to person, place, and time. He appears well-developed and well-nourished.  HENT:  Head: Normocephalic.  Respiratory: Effort normal.  Musculoskeletal: Normal range of motion.  Neurological: He is alert and oriented to person, place, and time.  Psychiatric: His speech is normal and behavior is normal. Thought content normal. His mood appears anxious. Cognition and memory are normal. He expresses impulsivity. He exhibits a depressed mood.    Review of Systems  Psychiatric/Behavioral: Positive for depression and substance abuse. Negative for hallucinations, memory loss and suicidal ideas. The patient is nervous/anxious. The patient does not have insomnia.   All other systems reviewed and are negative.   Blood pressure 137/80, pulse 65, temperature 97.7 F (36.5 C), resp. rate 12, height 5\' 5"  (1.651 m), weight 150 lb (68 kg), SpO2 96 %.Body mass index is 24.96 kg/m.  General Appearance: Disheveled  Eye Contact:  Good  Speech:  Clear and Coherent and Normal Rate  Volume:  Normal  Mood:  Anxious and Depressed  Affect:  Congruent and Depressed  Thought Process:  Coherent, Goal Directed, Linear and Descriptions of  Associations: Intact  Orientation:  Full (Time, Place, and Person)  Thought Content:  Logical  Suicidal Thoughts:  No  Homicidal Thoughts:  No  Memory:  Immediate;   Good Recent;   Good Remote;   Fair  Judgement:  Fair  Insight:  Fair  Psychomotor Activity:  Normal  Concentration:  Concentration: Good and Attention Span: Good  Recall:  Good  Fund of Knowledge:  Good  Language:  Good  Akathisia:  No  Handed:  Right  AIMS (if indicated):     Assets:  Psychologist, counselling Resources/Insurance  ADL's:  Intact  Cognition:  WNL  Sleep:        Demographic Factors:  Male, Caucasian, Low socioeconomic status, Living alone and Unemployed  Loss Factors: Legal issues and Financial  problems/change in socioeconomic status  Historical Factors: Impulsivity  Risk Reduction Factors:   Sense of responsibility to family  Continued Clinical Symptoms:  Depression:   Impulsivity Alcohol/Substance Abuse/Dependencies  Cognitive Features That Contribute To Risk:  Closed-mindedness    Suicide Risk:  Minimal: No identifiable suicidal ideation.  Patients presenting with no risk factors but with morbid ruminations; may be classified as minimal risk based on the severity of the depressive symptoms  Follow-up Information    Placey, Chales Abrahams, NP Follow up.   Why:  If you do not have a primary doctor you can see Chales Abrahams at the [facility].  They can also provide you homeless resources. Contact information: [address] [place] [address] [phone]           Plan Of Care/Follow-up recommendations:  Activity:  as tolerated Diet:  Heart healthy  Disposition: Discharge Home Follow up with [facility] for initial intake and medication management for depression Follow up Heide Spark, NP if you do not have a PCP for care of your cirrhosis and COPD Avoid the use of Alcohol and illicit drugs. No Rx were provided during this hospital ssay as Pt need sto follow up with  outpatient at [facility] or PCP.  Laveda Abbe, NP 10/19/2017, 11:14 AM

## 2017-10-19 NOTE — ED Notes (Signed)
Peer support at bedside
